# Patient Record
Sex: Female | Born: 1965 | Race: White | Hispanic: No | Smoking: Never smoker
Health system: Southern US, Community
[De-identification: ages and names within clinical notes are randomized; demographics above are authoritative.]

## PROBLEM LIST (undated history)

## (undated) DIAGNOSIS — K760 Fatty (change of) liver, not elsewhere classified: Secondary | ICD-10-CM

## (undated) DIAGNOSIS — I10 Essential (primary) hypertension: Secondary | ICD-10-CM

## (undated) DIAGNOSIS — G43909 Migraine, unspecified, not intractable, without status migrainosus: Secondary | ICD-10-CM

## (undated) DIAGNOSIS — M1990 Unspecified osteoarthritis, unspecified site: Secondary | ICD-10-CM

## (undated) DIAGNOSIS — K219 Gastro-esophageal reflux disease without esophagitis: Secondary | ICD-10-CM

## (undated) DIAGNOSIS — R079 Chest pain, unspecified: Secondary | ICD-10-CM

## (undated) DIAGNOSIS — E559 Vitamin D deficiency, unspecified: Secondary | ICD-10-CM

## (undated) DIAGNOSIS — Z8249 Family history of ischemic heart disease and other diseases of the circulatory system: Secondary | ICD-10-CM

## (undated) DIAGNOSIS — B001 Herpesviral vesicular dermatitis: Secondary | ICD-10-CM

## (undated) DIAGNOSIS — E7800 Pure hypercholesterolemia, unspecified: Secondary | ICD-10-CM

## (undated) HISTORY — DX: Chest pain, unspecified: R07.9

## (undated) HISTORY — PX: FINGER SURGERY: SHX640

## (undated) HISTORY — PX: TONSILECTOMY, ADENOIDECTOMY, BILATERAL MYRINGOTOMY AND TUBES: SHX2538

## (undated) HISTORY — DX: Gastro-esophageal reflux disease without esophagitis: K21.9

## (undated) HISTORY — DX: Family history of ischemic heart disease and other diseases of the circulatory system: Z82.49

## (undated) HISTORY — DX: Migraine, unspecified, not intractable, without status migrainosus: G43.909

## (undated) HISTORY — PX: FOOT SURGERY: SHX648

## (undated) HISTORY — DX: Essential (primary) hypertension: I10

## (undated) HISTORY — DX: Unspecified osteoarthritis, unspecified site: M19.90

## (undated) HISTORY — PX: INNER EAR SURGERY: SHX679

## (undated) HISTORY — DX: Pure hypercholesterolemia, unspecified: E78.00

## (undated) HISTORY — DX: Herpesviral vesicular dermatitis: B00.1

## (undated) HISTORY — DX: Fatty (change of) liver, not elsewhere classified: K76.0

## (undated) HISTORY — DX: Vitamin D deficiency, unspecified: E55.9

---

## 1988-05-25 HISTORY — PX: ABDOMINAL HYSTERECTOMY: SHX81

## 2009-09-09 ENCOUNTER — Ambulatory Visit (HOSPITAL_COMMUNITY): Admission: EM | Admit: 2009-09-09 | Discharge: 2009-09-09 | Payer: Self-pay | Admitting: Emergency Medicine

## 2010-08-12 LAB — CBC
HCT: 32.5 % — ABNORMAL LOW (ref 36.0–46.0)
Hemoglobin: 11.7 g/dL — ABNORMAL LOW (ref 12.0–15.0)
MCV: 88.3 fL (ref 78.0–100.0)
Platelets: 247 10*3/uL (ref 150–400)
RBC: 3.69 MIL/uL — ABNORMAL LOW (ref 3.87–5.11)
RDW: 12.4 % (ref 11.5–15.5)
WBC: 8.8 10*3/uL (ref 4.0–10.5)

## 2010-08-12 LAB — POCT CARDIAC MARKERS: CKMB, poc: 1.9 ng/mL (ref 1.0–8.0)

## 2010-08-12 LAB — PROTIME-INR: INR: 1.06 (ref 0.00–1.49)

## 2010-08-12 LAB — DIFFERENTIAL
Basophils Relative: 1 % (ref 0–1)
Eosinophils Absolute: 0.2 10*3/uL (ref 0.0–0.7)
Eosinophils Relative: 2 % (ref 0–5)
Lymphocytes Relative: 26 % (ref 12–46)
Monocytes Absolute: 0.4 10*3/uL (ref 0.1–1.0)
Neutrophils Relative %: 67 % (ref 43–77)

## 2010-08-12 LAB — APTT: aPTT: 32 seconds (ref 24–37)

## 2010-08-12 LAB — BASIC METABOLIC PANEL
Chloride: 108 mEq/L (ref 96–112)
Creatinine, Ser: 0.73 mg/dL (ref 0.4–1.2)
GFR calc Af Amer: >60 mL/min (ref >60)
Potassium: 3.4 mEq/L — ABNORMAL LOW (ref 3.5–5.1)

## 2010-08-12 LAB — TYPE AND SCREEN: Antibody Screen: NEGATIVE

## 2011-12-02 IMAGING — CR DG HAND COMPLETE 3+V*L*
3 series · 3 of 3 positions shown · non-contrast
Comparison: None.

CLINICAL DATA: Amputated second and third fingers

LEFT HAND - COMPLETE 3+ VIEW

[x hand pa left]
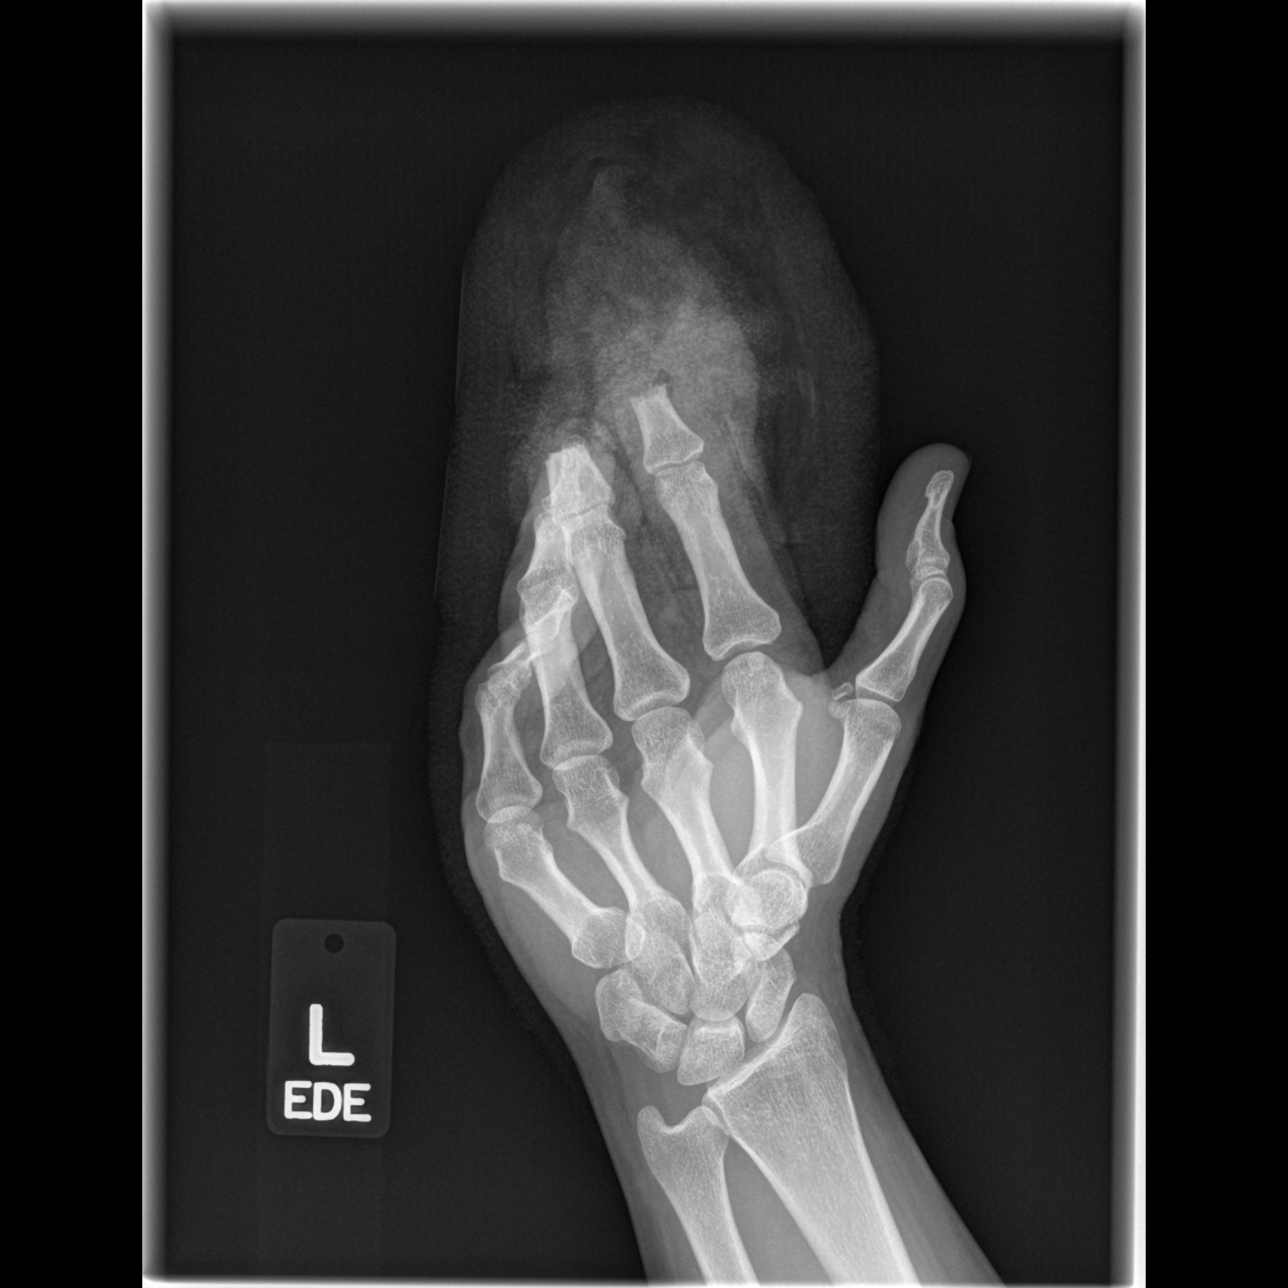

[x hand oblique left]
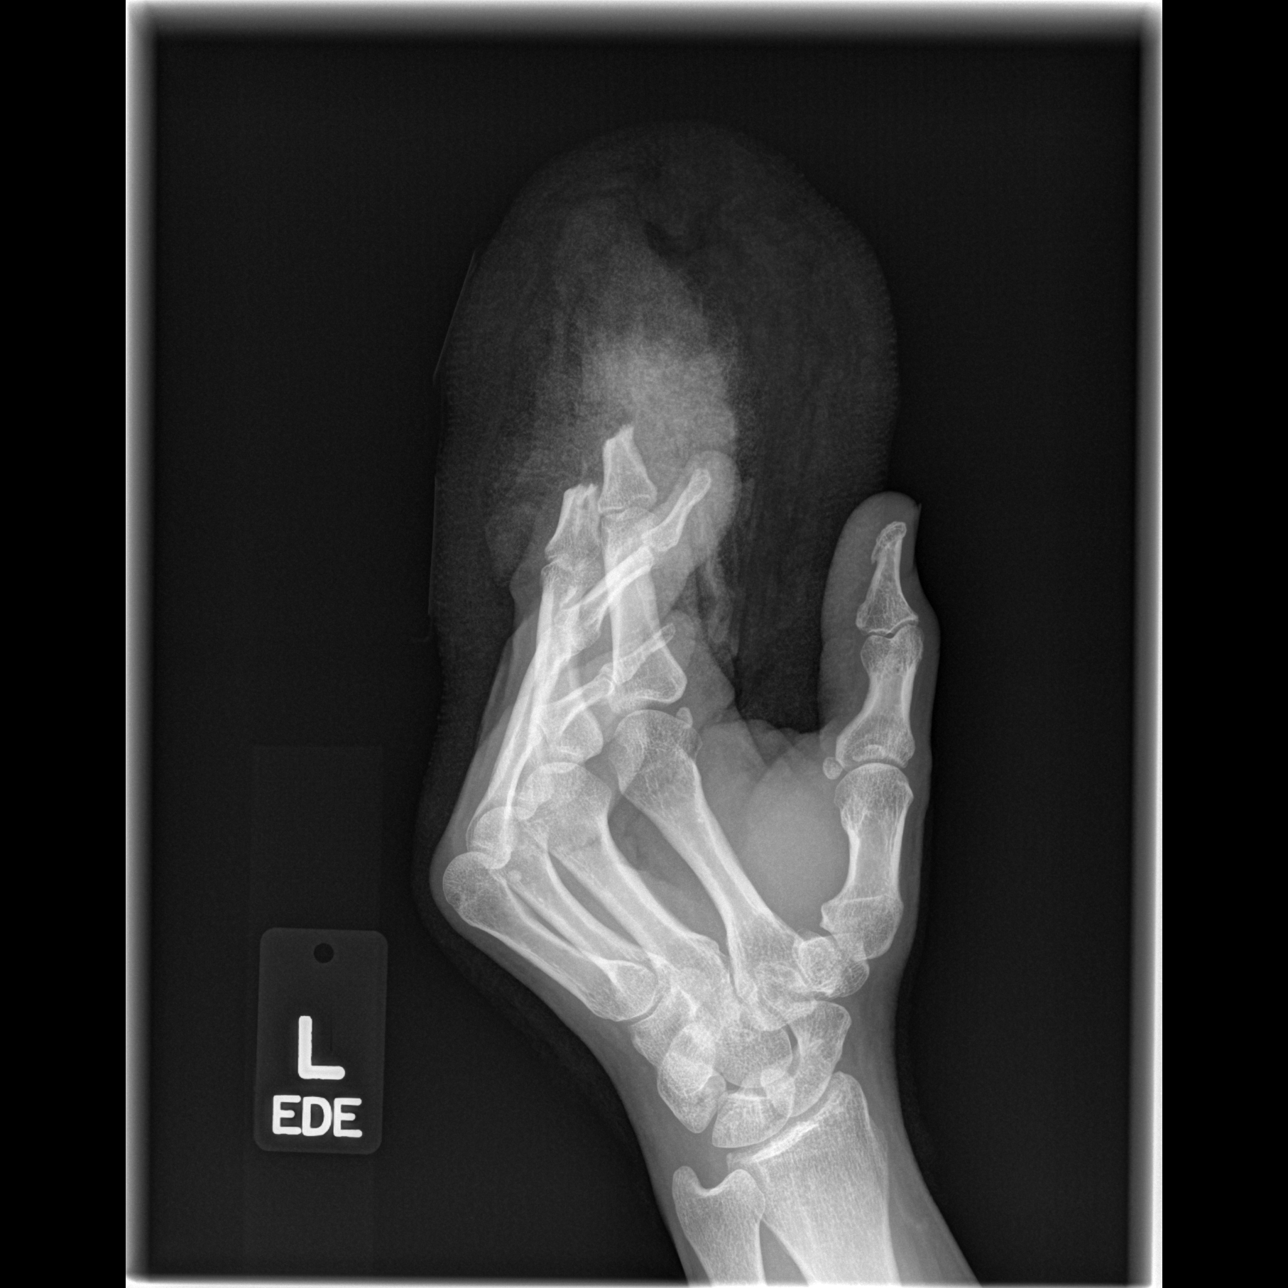

[x hand lat left]
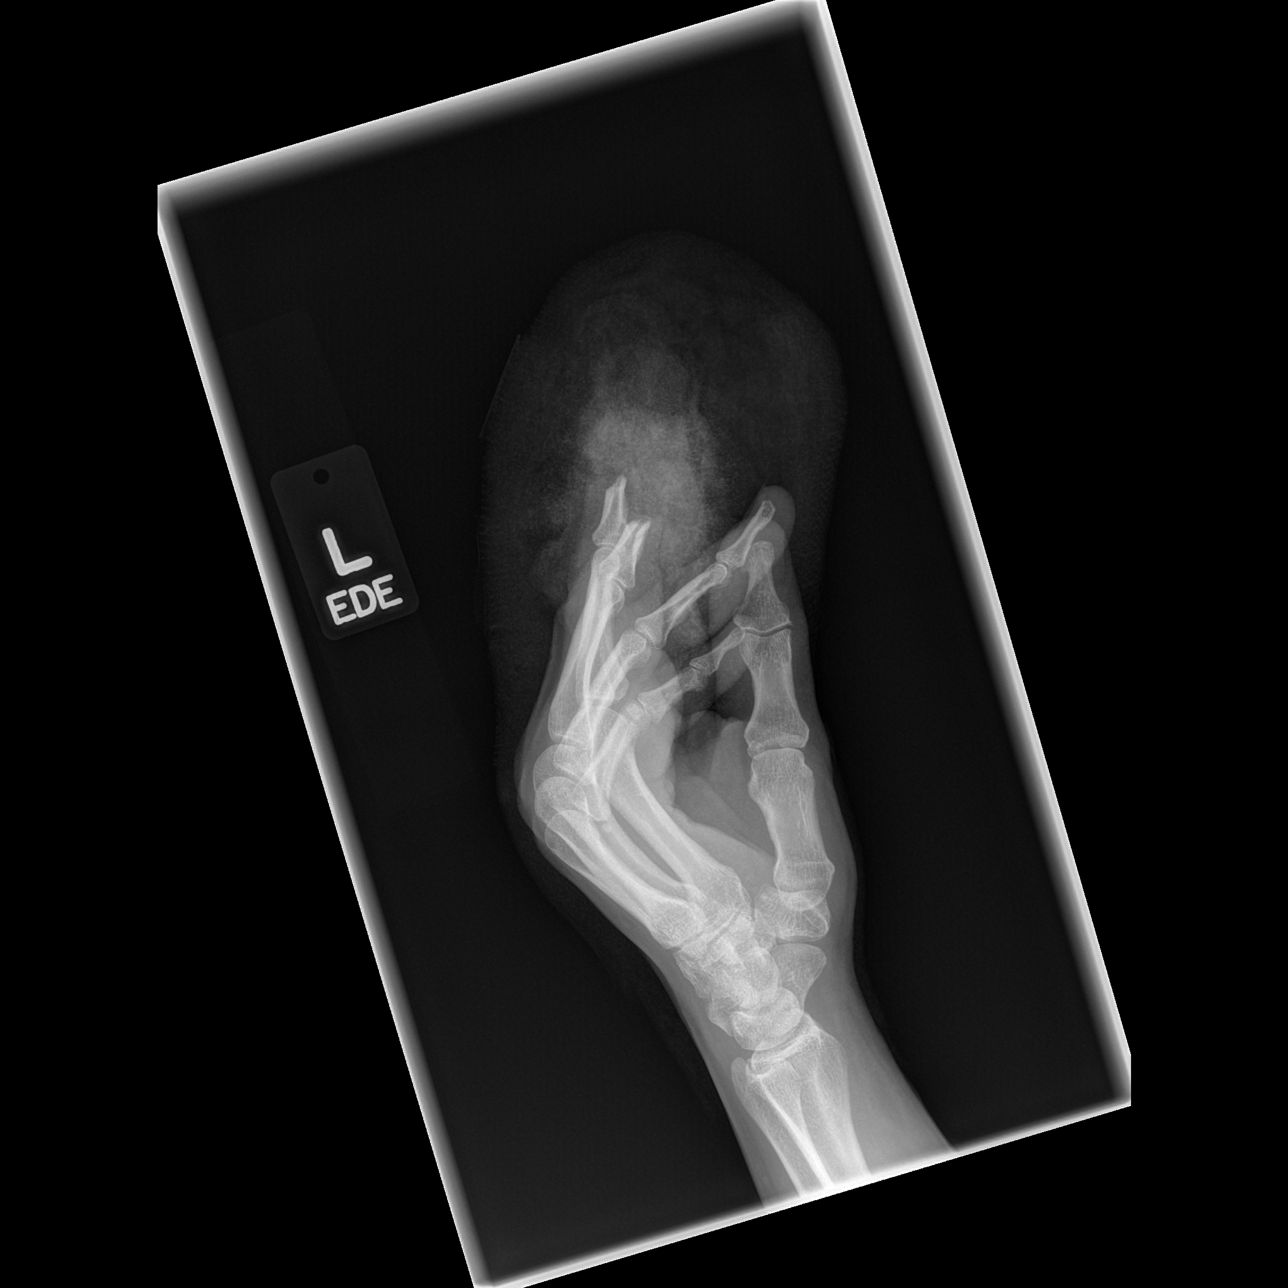

[3 of 3 positions shown; findings below may reference images not displayed]

FINDINGS: The second and third digits are amputated at the distal
middle phalanx level.  There is surrounding soft tissue swelling
and irregularity.
IMPRESSION: Second and third digits of the left hand are amputated at the
distal middle phalanx level.

## 2013-03-13 DIAGNOSIS — M25539 Pain in unspecified wrist: Secondary | ICD-10-CM

## 2013-03-13 HISTORY — DX: Pain in unspecified wrist: M25.539

## 2014-01-11 ENCOUNTER — Ambulatory Visit (INDEPENDENT_AMBULATORY_CARE_PROVIDER_SITE_OTHER): Payer: PRIVATE HEALTH INSURANCE

## 2014-01-11 VITALS — BP 129/83 | HR 75 | Resp 18

## 2014-01-11 DIAGNOSIS — M7660 Achilles tendinitis, unspecified leg: Secondary | ICD-10-CM

## 2014-01-11 DIAGNOSIS — M7730 Calcaneal spur, unspecified foot: Secondary | ICD-10-CM

## 2014-01-11 DIAGNOSIS — R52 Pain, unspecified: Secondary | ICD-10-CM

## 2014-01-11 DIAGNOSIS — M7750 Other enthesopathy of unspecified foot: Secondary | ICD-10-CM

## 2014-01-11 DIAGNOSIS — R269 Unspecified abnormalities of gait and mobility: Secondary | ICD-10-CM

## 2014-01-11 DIAGNOSIS — M722 Plantar fascial fibromatosis: Secondary | ICD-10-CM

## 2014-01-11 MED ORDER — PREDNISONE 10 MG PO KIT: PACK | ORAL | Status: DC

## 2014-01-11 NOTE — Patient Instructions (Signed)

## 2014-01-11 NOTE — Progress Notes (Signed)
   Subjective:    Patient ID: Kerri Jackson, female    DOB: 03-20-1966, 48 y.o.   MRN: [id number]  HPI my feet and heels are hurting me and have been for about 2 months now and i have an ankle brace on my right foot and hurts in my knee and hip and burns and swells and there is some numbness and they are sore and tender and i walk 8 hours at work and hurts in the morning    Review of Systems  Constitutional:       Hot flashes   Cardiovascular:       Swelling   Musculoskeletal:       Muscle pain   Neurological: Positive for headaches.  All other systems reviewed and are negative.      Objective:   Physical Exam 48 year old white female well-developed well-nourished oriented x3 presents at this time with pain discomfort right more so than left having pain in the inferior heel and arch anterior medial right ankle also she is having some pain due to the changing how she walks on her lateral leg I and hip area on the right side. No history of acute injury or trauma. Patient is been wearing good shoes just recently replaced with a new Felicie MornKirsch is although this not wear her sports at this time has had orthotics are worn and may need replacing at this point. She has not been using the orthotics and stands for long excessive hours may develop on fasciitis as well as capsulitis arthropathy. There is some swelling of the right ankle x-rays AP lateral oblique views bilateral demonstrate healed osteotomy first metatarsal bilateral with Eliberto IvoryAustin bunionectomy having been done clinically and radiographically good alignment of the hallux and lesser digits no fractures no other os abnormalities noted mild inferior calcaneal spur mild fascial thickening is identified patient wearing ankle stabilizer hasn't really helped much on her right side.      Assessment & Plan:  Assessment based on clinical reaggravated findings his plantar fasciitis/heel spur syndrome with compensatory gait changes patient is also  aggravated her ankle right more so than left forefoot is trying to walk on the ball foot as alternative to heal pressure. Also likely a living and causing some difficulty with her lateral right leg and hip. Plan at this time patient placed in fascial strapping on both feet we'll schedule for new orthotic casting his old orthotics are worn need replacing. Patient is also given a prescription for Sterapred Deas Dosepak x12 days reappointed with in 2 weeks for followup and reevaluation and appropriate orthotic dispensing within the orthotics are available. If no significant improvements may consider more aggressive invasive options steroid injection also things that physical therapy may also be considered in the future. Reschedule within the next 2 weeks as mentioned next  Alvan Dameichard Casmere Hollenbeck DPM

## 2014-01-18 ENCOUNTER — Ambulatory Visit (INDEPENDENT_AMBULATORY_CARE_PROVIDER_SITE_OTHER): Payer: PRIVATE HEALTH INSURANCE

## 2014-01-18 VITALS — BP 136/82 | HR 77 | Resp 18

## 2014-01-18 DIAGNOSIS — R52 Pain, unspecified: Secondary | ICD-10-CM

## 2014-01-18 DIAGNOSIS — M722 Plantar fascial fibromatosis: Secondary | ICD-10-CM

## 2014-01-18 DIAGNOSIS — R269 Unspecified abnormalities of gait and mobility: Secondary | ICD-10-CM

## 2014-01-18 DIAGNOSIS — M7730 Calcaneal spur, unspecified foot: Secondary | ICD-10-CM

## 2014-01-18 DIAGNOSIS — M7750 Other enthesopathy of unspecified foot: Secondary | ICD-10-CM

## 2014-01-18 MED ORDER — CELECOXIB 200 MG PO CAPS: ORAL_CAPSULE | ORAL | Status: DC

## 2014-01-18 MED ORDER — CELECOXIB 200 MG PO CAPS: 200.0000 mg | ORAL_CAPSULE | Freq: Two times a day (BID) | ORAL | Status: DC

## 2014-01-18 MED ORDER — CELECOXIB 200 MG PO CAPS
200.0000 mg | ORAL_CAPSULE | Freq: Two times a day (BID) | ORAL | Status: DC
Start: 2014-01-18 — End: 2014-01-18

## 2014-01-18 NOTE — Progress Notes (Signed)
   Subjective:    Patient ID: Kerri Jackson, female    DOB: [birth year], 48 y.o.   MRN: [id number]  HPI LAST NIGHT WAS ROUGH AND IT HURT IN MY FEET AND MY KNEE AND LEG AND MY RIGHT BUTT CHEEK    Review of Systems no new findings or systemic changes noted    Objective:   Physical Exam Or extremity objective findings as follows vascular status is intact pedal pulses palpable epicritic and proprioceptive sensations intact and unchanged patient indicates while the taping was in place the heels but much better and she her knee and hip may have felt better with taping was in place however continues to be painful and symptomatic with walking activities is a cramping in her calf at times although there is no palpable cord or nodule no increased temperature no edema or erythema noted more deep inside and aching pain or tightness that she's feeling in the joints knee and hip area not so much calf. Pain on palpation medial band plantar fascia bilateral continues to be painful and symptomatic assessment persistent plantar fasciitis/heel spur syndrome orthotics are covered for patient 80% will arrange for orthotic casting with the next week fascial strapping applied at this time      Assessment & Plan:  Assessment patient did have improvement with fascia taping reapply taping and schedule orthotic casting at her convenience patient however did not tolerate the steroid or Sterapred dose pack to [place] felt like her pressure interface and neck and chest we'll discontinue that patient request it's Celebrex at this time prescription of Celebrex 200 mg twice a day is issued at this time with one refill recheck in one week orthotic casting and then within a month for orthotic pickup and fitting and schedule contact us any difficulties also did give her a name and number for orthopedic doctor your aspirate to evaluate her knee and hip patient will follow up with Dr. at her convenience  Alvan Dame DPM

## 2014-01-18 NOTE — Addendum Note (Signed)
Addended by: Hadley Pen R on: 01/18/2014 05:57 PM   Modules accepted: Orders, Medications

## 2014-01-18 NOTE — Patient Instructions (Signed)
ICE INSTRUCTIONS  Apply ice or cold pack to the affected area at least 3 times a day for 10-15 minutes each time.  You should also use ice after prolonged activity or vigorous exercise.  Do not apply ice longer than 20 minutes at one time.  Always keep a cloth between your skin and the ice pack to prevent burns.  Being consistent and following these instructions will help control your symptoms.  We suggest you purchase a gel ice pack because they are reusable and do bit leak.  Some of them are designed to wrap around the area.  Use the method that works best for you.  Here are some other suggestions for icing.   Use a frozen bag of peas or corn-inexpensive and molds well to your body, usually stays frozen for 10 to 20 minutes.  Wet a towel with cold water and squeeze out the excess until it's damp.  Place in a bag in the freezer for 20 minutes. Then remove and use.   Recommend followup with an orthopedic doctor regarding her knee and hip for thorough evaluation

## 2014-01-23 ENCOUNTER — Ambulatory Visit (INDEPENDENT_AMBULATORY_CARE_PROVIDER_SITE_OTHER): Payer: PRIVATE HEALTH INSURANCE | Admitting: *Deleted

## 2014-01-23 DIAGNOSIS — M722 Plantar fascial fibromatosis: Secondary | ICD-10-CM

## 2014-01-23 NOTE — Progress Notes (Signed)
° °  Subjective:    Patient ID: Kerri Jackson, female    DOB: [birth year], 48 y.o.   MRN: [id number]  HPI MY FEET ARE KILLING ME AND I AM HERE TO GET CASTED. LC    Review of Systems     Objective:   Physical Exam        Assessment & Plan:

## 2014-01-23 NOTE — Patient Instructions (Signed)
I AM HERE TO GET CASTED. LC

## 2014-02-12 ENCOUNTER — Telehealth: Payer: Self-pay | Admitting: *Deleted

## 2014-02-12 NOTE — Telephone Encounter (Signed)
Could you give me a call back when you get a chance?  Thank you

## 2014-02-13 ENCOUNTER — Encounter: Payer: Self-pay | Admitting: Podiatrist

## 2014-02-13 ENCOUNTER — Ambulatory Visit (INDEPENDENT_AMBULATORY_CARE_PROVIDER_SITE_OTHER): Payer: PRIVATE HEALTH INSURANCE | Admitting: Podiatrist

## 2014-02-13 VITALS — BP 162/84 | HR 75 | Resp 18

## 2014-02-13 DIAGNOSIS — M722 Plantar fascial fibromatosis: Secondary | ICD-10-CM

## 2014-02-13 MED ORDER — TRIAMCINOLONE ACETONIDE 10 MG/ML IJ SUSP
10.0000 mg | Freq: Once | INTRAMUSCULAR | Status: AC
  Administered 2014-02-13: 10 mg

## 2014-02-13 NOTE — Telephone Encounter (Signed)
Patient called and stated that her right heel was killing her and we put her with Dr Irving Shows today and she got a injection in her right heel and her inserts came in as well and we dispensed them. lisa

## 2014-02-13 NOTE — Progress Notes (Signed)
Chief Complaint  Patient presents with  . Plantar Fasciitis    I WOULD LIKE AN INJECTION AND MY INSERTS ARE HERE ALSO     HPI: Patient is 48 y.o. female who presents today for an injection on her right heel. She states she's had injections in the past and she's overall scared of needles however her foot hurts so bad that she would like to try another injection at today's visit. She is also here to pick up her orthotics made for her by Dr. Ralene Cork.  Physical Exam  Patient is awake, alert, and oriented x 3.  In no acute distress.  Vascular status is intact with palpable pedal pulses at 2/4 DP and PT bilateral and capillary refill time within normal limits. Neurological sensation is also intact bilaterally via Semmes Weinstein monofilament at 5/5 sites. Light touch, vibratory sensation, Achilles tendon reflex is intact. Dermatological exam reveals skin color, turger and texture as normal. No open lesions present.  Musculature intact with dorsiflexion, plantarflexion, inversion, eversion. Pain on palpation plantar medial aspect of the right heel is noted. Palpable tenderness and swelling is also noted in this area.  Assessment: Plantar fasciitis right  Plan: Under sterile technique a injection of Kenalog and Marcaine plain was infiltrated into the area of maximal tenderness right heel. Patient tolerated this well. Orthotics were trimmed to fit her shoes and she is given instructions on wear. She'll be seen back for her to follow up with Dr. Ralene Cork as needed.

## 2015-05-06 ENCOUNTER — Encounter: Payer: Self-pay | Admitting: Podiatry

## 2015-05-06 ENCOUNTER — Ambulatory Visit (INDEPENDENT_AMBULATORY_CARE_PROVIDER_SITE_OTHER): Payer: PRIVATE HEALTH INSURANCE

## 2015-05-06 ENCOUNTER — Ambulatory Visit (INDEPENDENT_AMBULATORY_CARE_PROVIDER_SITE_OTHER): Payer: PRIVATE HEALTH INSURANCE | Admitting: Podiatry

## 2015-05-06 VITALS — BP 145/89 | HR 87 | Resp 14

## 2015-05-06 DIAGNOSIS — R52 Pain, unspecified: Secondary | ICD-10-CM | POA: Diagnosis not present

## 2015-05-06 DIAGNOSIS — M722 Plantar fascial fibromatosis: Secondary | ICD-10-CM

## 2015-05-06 MED ORDER — MELOXICAM 15 MG PO TABS: 15.0000 mg | ORAL_TABLET | Freq: Every day | ORAL | Status: DC

## 2015-05-06 NOTE — Addendum Note (Signed)
Addended by: Harlon FlorSOUTHERLAND, Azalee Weimer L on: 05/06/2015 10:35 AM   Modules accepted: Orders

## 2015-05-06 NOTE — Progress Notes (Signed)
Subjective:     Patient ID: Kerri Jackson, female   DOB: 1966/05/12, 49 y.o.   MRN: [id number]  HPI this patient presents the office with chief complaint of a painful left heel. This patient states that she fell down about 3 months ago and injured her left foot. She says that her heel became swollen and purplish. She says she return to work immediately and has taken ibuprofen for her pain. She is wearing [facility] that [facility]. She presents the office today for an evaluation and treatment of this condition   Review of Systems     Objective:   Physical Exam GENERAL APPEARANCE: Alert, conversant. Appropriately groomed. No acute distress.  VASCULAR: Pedal pulses palpable at  [facility] bilateral.  Capillary refill time is immediate to all digits,  Normal temperature gradient.  Digital hair growth is present bilateral  NEUROLOGIC: sensation is normal to 5.07 monofilament at 5/5 sites bilateral.  Light touch is intact bilateral, Muscle strength normal.  MUSCULOSKELETAL: acceptable muscle strength, tone and stability bilateral.  Intrinsic muscluature intact bilateral.  Rectus appearance of foot and digits noted bilateral. Palpable pain distal to the insertion plantar fascia left foot.  No evidence of redness or swelling noted.  DERMATOLOGIC: skin color, texture, and turgor are within normal limits.  No preulcerative lesions or ulcers  are seen, no interdigital maceration noted.  No open lesions present.  Digital nails are asymptomatic. No drainage noted.      Assessment:     Plantar fascitis left foot.     Plan:     ROV  X-ray reveal no pathology.  Patient was told to discontinue her ibuprofen and take Mobic.  Two plantar fascial straps were dispensed. RTC 1 week for reevaluation and possible consider unna boot application if the problem persists.  Helane GuntherGregory Dawson Hollman DPM

## 2015-05-13 ENCOUNTER — Ambulatory Visit: Payer: PRIVATE HEALTH INSURANCE | Admitting: Podiatry

## 2015-05-30 ENCOUNTER — Ambulatory Visit (INDEPENDENT_AMBULATORY_CARE_PROVIDER_SITE_OTHER): Payer: PRIVATE HEALTH INSURANCE | Admitting: Sports Medicine

## 2015-05-30 ENCOUNTER — Encounter: Payer: Self-pay | Admitting: Sports Medicine

## 2015-05-30 DIAGNOSIS — M79672 Pain in left foot: Secondary | ICD-10-CM | POA: Diagnosis not present

## 2015-05-30 DIAGNOSIS — M79671 Pain in right foot: Secondary | ICD-10-CM

## 2015-05-30 DIAGNOSIS — M722 Plantar fascial fibromatosis: Secondary | ICD-10-CM

## 2015-05-30 MED ORDER — TRIAMCINOLONE ACETONIDE 10 MG/ML IJ SUSP: 10.0000 mg | Freq: Once | INTRAMUSCULAR | Status: DC

## 2015-05-30 MED ORDER — METHYLPREDNISOLONE 4 MG PO TBPK: ORAL_TABLET | ORAL | Status: DC

## 2015-05-30 NOTE — Patient Instructions (Signed)

## 2015-05-30 NOTE — Progress Notes (Signed)
Patient ID: Kerri Jackson, female   DOB: 1966-05-16, 50 y.o.   MRN: [id number] Subjective: Kerri Jackson is a 50 y.o. female patient presents to office with complaint of heel pain on the left>right. Patient admits to post static dyskinesia for several months in duration. Saw Dr. Prudence Davidson earlier in December and reports that she is still in a lot of pain, was given a fascial strap in which she had to pay $100 for because her insurance would not cover it patient states that she has had a known history of this states that her plantar fasciitis on the right is doing much better with the plantar fascial strap; admits to a previous history of fasciotomy on the right side.  However, patient states that her left heel is extremely tender, tender, most with pressure after a period sitting states that also her muscles in the left leg are extremely tight and feel as if the pain is shooting up the back of the leg.  In the past. Patient has had injections orthotics several treatments for fasciitis; denies any recent injection within the last year.  Patient states that she gets the most relief with ice the fascial brace is and Motrin.  Denies any other pedal complaints at this time.   There are no active problems to display for this patient.  Current Outpatient Prescriptions on File Prior to Visit  Medication Sig Dispense Refill  . cefdinir (OMNICEF) 300 MG capsule     . celecoxib (CELEBREX) 200 MG capsule Take one capsule by mouth two times daily 180 capsule 0  . fluconazole (DIFLUCAN) 150 MG tablet     . meloxicam (MOBIC) 15 MG tablet Take 1 tablet (15 mg total) by mouth daily. 30 tablet 0  . metoprolol succinate (TOPROL-XL) 100 MG 24 hr tablet     . polyethylene glycol powder (GLYCOLAX/MIRALAX) powder     . PredniSONE 10 MG KIT Take as instructed for 12 days, 6 pills days 1 and 2,  5 pills each days 3 and 4, 4 pills each days 5 and 6.. and so on until completed 48 each 0  . verapamil (CALAN-SR) 120 MG CR tablet      No  current facility-administered medications on file prior to visit.   No Known Allergies   Objective: Physical Exam General: The patient is alert and oriented x3 in no acute distress.  Dermatology: Skin is warm, dry and supple bilateral lower extremities. Nails 1-10 are normal. There is no erythema,  no eccymosis, no open lesions present. Integument is otherwise unremarkable.  Vascular: Dorsalis Pedis pulse and Posterior Tibial pulse are 2/4 bilateral. Capillary fill time is immediate to all digits.  Neurological: Grossly intact to light touch with an achilles reflex of +2/5 and a negative Tinel's sign bilateral.  Musculoskeletal: Tenderness to palpation at the medial calcaneal tubercale and through the insertion of the plantar fascia on the left foot with associated soft tissue swelling. No pain with compression of calcaneus bilateral. No pain with tuning fork to calcaneus bilateral. No pain with calf compression bilateral. There is decreased Ankle joint range of motion bilateral  Left significantly greater than right. All other joints range of motion within normal limits bilateral. Strength 5/5 in all groups bilateral.   Assessment and Plan: Problem List Items Addressed This Visit    None    Visit Diagnoses    Plantar fasciitis, bilateral    -  Primary    L>R    Relevant Medications    triamcinolone acetonide (  KENALOG) 10 MG/ML injection 10 mg (Start on 05/30/2015  4:45 PM)    methylPREDNISolone (MEDROL DOSEPAK) 4 MG TBPK tablet    Foot pain, bilateral        Relevant Medications    triamcinolone acetonide (KENALOG) 10 MG/ML injection 10 mg (Start on 05/30/2015  4:45 PM)    methylPREDNISolone (MEDROL DOSEPAK) 4 MG TBPK tablet      -Complete examination performed. Discussed with patient in detail the condition of plantar fasciitis, how this occurs and general treatment options. Explained both conservative and surgical treatments.  -After oral consent and aseptic prep, injected a mixture  containing 1 ml of 2%  plain lidocaine, 1 ml 0.5% plain marcaine, 0.5 ml of kenalog 10 and 0.5 ml of dexamethasone phosphate into left heel. Post-injection care discussed with patient.  -Rx Medrol dose pack Once complete to take Motrin as needed. No other anti-inflammatories given this time due to Patient history of hypertension.  -Recommended Patient to use cam walker of which she owns for 1 week with slow transition back to good supportive shoes and advised use of inserts.  -Patient to continue with plantar fascial brace with bilateral  -Patient declined night splint due to concern of cost If not covered by her insurance -Explained and dispensed to patient daily stretching exercises. -Recommend patient to ice affected area 1-2x daily. -Patient to return to office in 3 weeks for follow up or sooner if problems or questions arise.  At next encoutner will consider his physical therapy at [facility] with dry needling.   Landis Martins, DPM

## 2015-06-20 ENCOUNTER — Encounter: Payer: Self-pay | Admitting: Sports Medicine

## 2015-06-20 ENCOUNTER — Ambulatory Visit (INDEPENDENT_AMBULATORY_CARE_PROVIDER_SITE_OTHER): Payer: PRIVATE HEALTH INSURANCE | Admitting: Sports Medicine

## 2015-06-20 DIAGNOSIS — M79672 Pain in left foot: Secondary | ICD-10-CM

## 2015-06-20 DIAGNOSIS — M79671 Pain in right foot: Secondary | ICD-10-CM

## 2015-06-20 DIAGNOSIS — M722 Plantar fascial fibromatosis: Secondary | ICD-10-CM | POA: Diagnosis not present

## 2015-06-20 MED ORDER — OXYCODONE-ACETAMINOPHEN 7.5-325 MG PO TABS: 1.0000 | ORAL_TABLET | Freq: Three times a day (TID) | ORAL | Status: DC | PRN

## 2015-06-20 NOTE — Progress Notes (Signed)
Patient ID: Kerri Jackson, female   DOB: July 28, 1965, 50 y.o.   MRN: [id number]  Subjective: Kerri Jackson is a 50 y.o. female patient presents to office with complaint of heel pain on the left>right. Patient admits to post static dyskinesia for several months in duration. States that the injection helped for a few hours then got really painful; reports that she went walking on beach barefoot and pain got really bad; states that she went to get a second opinion who said that she had fasciitis but could not rule out hair line fracture.  Denies any other pedal complaints at this time.   There are no active problems to display for this patient.  Current Outpatient Prescriptions on File Prior to Visit  Medication Sig Dispense Refill  . cefdinir (OMNICEF) 300 MG capsule     . celecoxib (CELEBREX) 200 MG capsule Take one capsule by mouth two times daily 180 capsule 0  . fluconazole (DIFLUCAN) 150 MG tablet     . meloxicam (MOBIC) 15 MG tablet Take 1 tablet (15 mg total) by mouth daily. 30 tablet 0  . methylPREDNISolone (MEDROL DOSEPAK) 4 MG TBPK tablet Take as instructed 21 tablet 0  . metoprolol succinate (TOPROL-XL) 100 MG 24 hr tablet     . polyethylene glycol powder (GLYCOLAX/MIRALAX) powder     . PredniSONE 10 MG KIT Take as instructed for 12 days, 6 pills days 1 and 2,  5 pills each days 3 and 4, 4 pills each days 5 and 6.. and so on until completed 48 each 0  . verapamil (CALAN-SR) 120 MG CR tablet      Current Facility-Administered Medications on File Prior to Visit  Medication Dose Route Frequency Provider Last Rate Last Dose  . triamcinolone acetonide (KENALOG) 10 MG/ML injection 10 mg  10 mg Other Once [employer], DPM       No Known Allergies   Objective: Physical Exam General: The patient is alert and oriented x3 in no acute distress.  Dermatology: Skin is warm, dry and supple bilateral lower extremities. Nails 1-10 are normal. There is no erythema,  no eccymosis, no open lesions  present. Integument is otherwise unremarkable.  Vascular: Dorsalis Pedis pulse and Posterior Tibial pulse are 2/4 bilateral. Capillary fill time is immediate to all digits.  Neurological: Grossly intact to light touch with an achilles reflex of +2/5 and a negative Tinel's sign bilateral.  Musculoskeletal: Moderate tenderness to palpation at the medial calcaneal tubercale and through the insertion of the plantar fascia on the left foot with associated soft tissue swelling. No pain on right. No pain with compression of calcaneus bilateral. No pain with tuning fork to calcaneus bilateral. No pain with calf compression bilateral. There is decreased Ankle joint range of motion bilateral  Left significantly greater than right. All other joints range of motion within normal limits bilateral. Strength 5/5 in all groups bilateral.   Assessment and Plan: Problem List Items Addressed This Visit    None    Visit Diagnoses    Plantar fasciitis, bilateral    -  Primary    L>R with acute exacerabation     Relevant Medications    oxyCODONE-acetaminophen (PERCOCET) 7.5-325 MG tablet    Foot pain, bilateral        Relevant Medications    oxyCODONE-acetaminophen (PERCOCET) 7.5-325 MG tablet      -Complete examination performed.  -Previous xrays reviewed -Placed Patient in short leg fiberglass cast on left due to extreme symptoms and to protect  area to allow soft tissues/bone to heal. Advised patient to start 325 mg aspirin to prevent DVT. Advised patient she should be nonweightbearing with assistive crushes thus patient will have to refrain from work for the next 2 weeks; patient will bring in [facility] paperwork.  -Prescribed Percocet to take as needed for pain -Recommend patient to ice affected area 1-2x daily. -Patient to return to office in 2 weeks for follow up or sooner if problems or questions arise.   Landis Martins, DPM

## 2015-06-21 ENCOUNTER — Encounter: Payer: Self-pay | Admitting: *Deleted

## 2015-06-21 ENCOUNTER — Telehealth: Payer: Self-pay | Admitting: *Deleted

## 2015-06-21 NOTE — Telephone Encounter (Addendum)
Pt's HR - Heriberto Antigua request a note from Dr. [name] stating pt is to be out of work for 2 weeks.  Letter done and faxed.

## 2015-07-04 ENCOUNTER — Encounter: Payer: Self-pay | Admitting: Sports Medicine

## 2015-07-04 ENCOUNTER — Ambulatory Visit (INDEPENDENT_AMBULATORY_CARE_PROVIDER_SITE_OTHER): Payer: PRIVATE HEALTH INSURANCE

## 2015-07-04 ENCOUNTER — Ambulatory Visit (INDEPENDENT_AMBULATORY_CARE_PROVIDER_SITE_OTHER): Payer: PRIVATE HEALTH INSURANCE | Admitting: Sports Medicine

## 2015-07-04 DIAGNOSIS — M722 Plantar fascial fibromatosis: Secondary | ICD-10-CM | POA: Diagnosis not present

## 2015-07-04 DIAGNOSIS — M79672 Pain in left foot: Secondary | ICD-10-CM

## 2015-07-04 DIAGNOSIS — M79671 Pain in right foot: Secondary | ICD-10-CM

## 2015-07-04 MED ORDER — DICLOFENAC SODIUM 75 MG PO TBEC: 75.0000 mg | DELAYED_RELEASE_TABLET | Freq: Two times a day (BID) | ORAL | Status: DC

## 2015-07-04 MED ORDER — METHYLPREDNISOLONE 4 MG PO TBPK: ORAL_TABLET | ORAL | Status: DC

## 2015-07-04 NOTE — Progress Notes (Signed)
Patient ID: Kerri Jackson, female   DOB: 11/17/1965, 50 y.o.   MRN: [id number]  Subjective: Kerri Jackson is a 50 y.o. female patient returns to office for follow up eval for heel pain on the left>right. Patient has been in cast for 2 weeks with no issues. States that she took about 5 motrin and 5 pain pills. States that the area feels better and the cast was very helpful in making sure she stays off her foot; Patient came into office today walking on cast states that she does this sometime because her house has 16 steps.  Denies any other pedal complaints at this time.   There are no active problems to display for this patient.  Current Outpatient Prescriptions on File Prior to Visit  Medication Sig Dispense Refill  . celecoxib (CELEBREX) 200 MG capsule Take one capsule by mouth two times daily 180 capsule 0  . meloxicam (MOBIC) 15 MG tablet Take 1 tablet (15 mg total) by mouth daily. 30 tablet 0  . metoprolol succinate (TOPROL-XL) 100 MG 24 hr tablet     . oxyCODONE-acetaminophen (PERCOCET) 7.5-325 MG tablet Take 1 tablet by mouth every 8 (eight) hours as needed for severe pain. 60 tablet 0  . polyethylene glycol powder (GLYCOLAX/MIRALAX) powder     . verapamil (CALAN-SR) 120 MG CR tablet      Current Facility-Administered Medications on File Prior to Visit  Medication Dose Route Frequency Provider Last Rate Last Dose  . triamcinolone acetonide (KENALOG) 10 MG/ML injection 10 mg  10 mg Other Once [employer], DPM       No Known Allergies   Objective: Physical Exam General: The patient is alert and oriented x3 in no acute distress. Cast to left lower extremity is soiled with evidence of breakdown from walking on cast  Dermatology: Skin is warm, dry and supple bilateral lower extremities. Nails 1-10 are normal. There is no erythema,  no eccymosis, no open lesions present. Integument is otherwise unremarkable.  Vascular: Dorsalis Pedis pulse and Posterior Tibial pulse are 2/4 bilateral.  Capillary fill time is immediate to all digits.  Neurological: Grossly intact to light touch with an achilles reflex of +2/5 and a negative Tinel's sign bilateral.  Musculoskeletal: Decreased tenderness to palpation at the medial calcaneal tubercale and through the insertion of the plantar fascia on the left foot with associated decreased soft tissue swelling. No pain on right. No pain with compression of calcaneus bilateral. No pain with tuning fork to calcaneus bilateral. No pain with calf compression bilateral. There is decreased Ankle joint range of motion bilateral  Left significantly greater than right. All other joints range of motion within normal limits bilateral. Strength 5/5 in all groups bilateral.   Xrays, Left foot: Normal osseous mineralization. There is no fracture or dislocation. Inferior calcaneal heel spur with mild thickening of the plantar fascia hardware intact to the first metatarsal, soft tissues within normal limits. No other acute pathology.  Assessment and Plan: Problem List Items Addressed This Visit    None    Visit Diagnoses    Plantar fasciitis, bilateral    -  Primary    L>R    Foot pain, bilateral          -Complete examination performed -Removed fiberglass below knee cast on left -Xrays obtained and reviewed -Transitioned Patient to postop shoe on left. Advised patient that she should limit ambulation to necessity and should refrain from work for another 2 weeks If modification/light duty is not possible. Patient  to call HR to find out work restrictions and will call office to have office to fax a work note on her behalf.  -Prescribed diclofenac to start once Medrol Dosepak is completed and continue with Percocet as needed for pain -Patient may use an Ace wrap to support area as needed -Recommend patient to ice affected area 1-2x daily. -Patient to return to office in 2 weeks for follow up or sooner if problems or questions arise.   [name],  DPM

## 2015-07-08 ENCOUNTER — Encounter: Payer: Self-pay | Admitting: Sports Medicine

## 2015-07-18 ENCOUNTER — Encounter: Payer: Self-pay | Admitting: Sports Medicine

## 2015-07-18 ENCOUNTER — Ambulatory Visit (INDEPENDENT_AMBULATORY_CARE_PROVIDER_SITE_OTHER): Payer: PRIVATE HEALTH INSURANCE | Admitting: Sports Medicine

## 2015-07-18 DIAGNOSIS — M79671 Pain in right foot: Secondary | ICD-10-CM | POA: Diagnosis not present

## 2015-07-18 DIAGNOSIS — M79672 Pain in left foot: Secondary | ICD-10-CM

## 2015-07-18 DIAGNOSIS — M722 Plantar fascial fibromatosis: Secondary | ICD-10-CM | POA: Diagnosis not present

## 2015-07-18 NOTE — Progress Notes (Signed)
Patient ID: Kerri Jackson, female   DOB: 1965/07/20, 50 y.o.   MRN: [id number]  Subjective: Kerri Jackson is a 50 y.o. female patient returns to office for follow up eval for heel pain on the left>right. Patient has been in post op shoe for 2 weeks with no issues. States that her foot feels a little better but still hurts some and feels "tight".  Denies any other pedal complaints at this time.   There are no active problems to display for this patient.  Current Outpatient Prescriptions on File Prior to Visit  Medication Sig Dispense Refill  . celecoxib (CELEBREX) 200 MG capsule Take one capsule by mouth two times daily 180 capsule 0  . diclofenac (VOLTAREN) 75 MG EC tablet Take 1 tablet (75 mg total) by mouth 2 (two) times daily. 30 tablet 0  . meloxicam (MOBIC) 15 MG tablet Take 1 tablet (15 mg total) by mouth daily. 30 tablet 0  . methylPREDNISolone (MEDROL DOSEPAK) 4 MG TBPK tablet Take as instructed 21 tablet 0  . metoprolol succinate (TOPROL-XL) 100 MG 24 hr tablet     . oxyCODONE-acetaminophen (PERCOCET) 7.5-325 MG tablet Take 1 tablet by mouth every 8 (eight) hours as needed for severe pain. 60 tablet 0  . polyethylene glycol powder (GLYCOLAX/MIRALAX) powder     . verapamil (CALAN-SR) 120 MG CR tablet      Current Facility-Administered Medications on File Prior to Visit  Medication Dose Route Frequency Provider Last Rate Last Dose  . triamcinolone acetonide (KENALOG) 10 MG/ML injection 10 mg  10 mg Other Once [employer], DPM       No Known Allergies   Objective: Physical Exam General: The patient is alert and oriented x3 in no acute distress.  Dermatology: Skin is warm, dry and supple bilateral lower extremities. Nails 1-10 are normal. There is no erythema,  no eccymosis, no open lesions present. Integument is otherwise unremarkable.  Vascular: Dorsalis Pedis pulse and Posterior Tibial pulse are 2/4 bilateral. Capillary fill time is immediate to all digits.  Neurological:  Grossly intact to light touch with an achilles reflex of +2/5 and a negative Tinel's sign bilateral.  Musculoskeletal: Decreased tenderness to palpation at the medial calcaneal tubercale and through the insertion of the plantar fascia on the left foot with associated decreased soft tissue swelling. No pain on right. No pain with compression of calcaneus bilateral. No pain with tuning fork to calcaneus bilateral. No pain with calf compression bilateral. There is decreased Ankle joint range of motion bilateral  Left significantly greater than right. All other joints range of motion within normal limits bilateral. Strength 5/5 in all groups bilateral.   Assessment and Plan: Problem List Items Addressed This Visit    None    Visit Diagnoses    Plantar fasciitis, bilateral    -  Primary    L>R    Foot pain, bilateral          -Complete examination performed -Patient to transition from postop shoe on left to good supportive sneaker with use of fascial brace. Advised patient that she is cleared to return to regular duty work starting her normal Monday night shift on 07-22-15 with the use of fascial brace on left  -Patient to take diclofenac or Ibuprofren as needed for pain and inflammation -Recommend patient to ice affected area 1-2x daily. -Rx given for PT at [facility] 2x/wk for 4wks -Patient to return to office in 4 weeks for follow up or sooner if problems or questions arise.  Landis Martins, DPM

## 2015-07-22 ENCOUNTER — Telehealth: Payer: Self-pay | Admitting: *Deleted

## 2015-07-22 ENCOUNTER — Encounter: Payer: Self-pay | Admitting: *Deleted

## 2015-07-22 NOTE — Telephone Encounter (Addendum)
-----   Message from [name], [place] sent at 07/18/2015  9:49 AM EST ----- Regarding: Fax to Work/HR  Can we fax this to patient's job/HR dept... Thanks Dr. [name]   "Return to regular duty work starting her normal Monday night shift on 07-22-15 with the use of fascial brace on left" 07/22/2015-INFORMED PT HER NOTE TO return to work could be faxed if she had the number, pt states she will pick up the note today.  I informed pt there would not be anyone in the [place] office today, that I would mail it to her.  08/01/2015-A. HORTON STATES PT IS AT [facility] without her Physical therapy order.  I reviewed orders and Dr. [name] had sent PT orders with pt.  I wrote out evaluation and treat for plantar fasciitis B/L ROM, Home exercises and faxed to [phone].

## 2015-08-15 ENCOUNTER — Ambulatory Visit (INDEPENDENT_AMBULATORY_CARE_PROVIDER_SITE_OTHER): Payer: PRIVATE HEALTH INSURANCE | Admitting: Sports Medicine

## 2015-08-15 ENCOUNTER — Telehealth: Payer: Self-pay | Admitting: *Deleted

## 2015-08-15 ENCOUNTER — Encounter: Payer: Self-pay | Admitting: Sports Medicine

## 2015-08-15 DIAGNOSIS — M722 Plantar fascial fibromatosis: Secondary | ICD-10-CM | POA: Diagnosis not present

## 2015-08-15 DIAGNOSIS — M79672 Pain in left foot: Secondary | ICD-10-CM

## 2015-08-15 DIAGNOSIS — M79671 Pain in right foot: Secondary | ICD-10-CM

## 2015-08-15 NOTE — Progress Notes (Signed)
Patient ID: Kerri Jackson, female   DOB: Oct 04, 1965, 50 y.o.   MRN: [id number]  Subjective: Kerri Jackson is a 50 y.o. female patient returns to office for follow up eval for heel pain on the left>right. Patient has been  Back at work and states that her left heel is still in pain and she wants it surgically taken care of admits that she had right procedure done for her plantar fascia and that pain on the right side is tolerable. However, the pain on her left is not.  Denies any other pedal complaints at this time.   Patient has completed physical therapy and at this point still desires surgery.   There are no active problems to display for this patient.  Current Outpatient Prescriptions on File Prior to Visit  Medication Sig Dispense Refill  . celecoxib (CELEBREX) 200 MG capsule Take one capsule by mouth two times daily 180 capsule 0  . diclofenac (VOLTAREN) 75 MG EC tablet Take 1 tablet (75 mg total) by mouth 2 (two) times daily. 30 tablet 0  . meloxicam (MOBIC) 15 MG tablet Take 1 tablet (15 mg total) by mouth daily. 30 tablet 0  . methylPREDNISolone (MEDROL DOSEPAK) 4 MG TBPK tablet Take as instructed 21 tablet 0  . metoprolol succinate (TOPROL-XL) 100 MG 24 hr tablet     . oxyCODONE-acetaminophen (PERCOCET) 7.5-325 MG tablet Take 1 tablet by mouth every 8 (eight) hours as needed for severe pain. 60 tablet 0  . polyethylene glycol powder (GLYCOLAX/MIRALAX) powder     . verapamil (CALAN-SR) 120 MG CR tablet      Current Facility-Administered Medications on File Prior to Visit  Medication Dose Route Frequency Provider Last Rate Last Dose  . triamcinolone acetonide (KENALOG) 10 MG/ML injection 10 mg  10 mg Other Once [name], DPM       No Known Allergies   Past Surgical History  Procedure Laterality Date  . Cesarean section    . Finger surgery      left hand    Social History   Social History  . Marital Status: Divorced    Spouse Name: N/A  . Number of Children: N/A  . Years  of Education: N/A   Occupational History  . Not on file.   Social History Main Topics  . Smoking status: Never Smoker   . Smokeless tobacco: Never Used  . Alcohol Use: No  . Drug Use: No  . Sexual Activity: Not on file   Other Topics Concern  . Not on file   Social History Narrative   No family history on file.  Objective: Physical Exam General: The patient is alert and oriented x3 in no acute distress.  Dermatology: Skin is warm, dry and supple bilateral lower extremities. Nails 1-10 are normal. There is no erythema,  no eccymosis, no open lesions present. Integument is otherwise unremarkable.  Vascular: Dorsalis Pedis pulse and Posterior Tibial pulse are 2/4 bilateral. Capillary fill time is immediate to all digits.  Neurological: Grossly intact to light touch with an achilles reflex of +2/5 and a negative Tinel's sign bilateral.  Musculoskeletal: There is tenderness to palpation at the medial calcaneal tubercale and through the insertion of the plantar fascia on the left foot with associated decreased soft tissue swelling. No pain on right. No pain with compression of calcaneus bilateral. No pain with tuning fork to calcaneus bilateral. No pain with calf compression bilateral. There is improved Ankle joint range of motion bilateral. All other joints range of  motion within normal limits bilateral. Strength 5/5 in all groups bilateral.   Assessment and Plan: Problem List Items Addressed This Visit    None    Visit Diagnoses    Plantar fasciitis, bilateral    -  Primary    L>R    Foot pain, bilateral          -Complete examination performed -Patient opt for surgical management. Consent obtained for Left open versus endoscopic plantar fascial release with removal of heel spur. Pre and Post op course explained. Risks, benefits, alternatives explained. No guarantees given or implied. Surgical booking slip submitted and provided patient with Surgical packet and info for [facility].   Office to schedule. - Patient will use her CAM boot that she already owns post op. -Patient to take diclofenac or Ibuprofren as needed for pain and inflammation. -Recommend patient to ice affected area 1-2x daily as needed.  - Recommend to continue with daily stretching in good supportive shoes in the meantime.  -Patient to send FMLA and car insurance paperwork to office for completion for anticipated surgery recovery time frame -Patient to return to office after surgery or sooner if problems or questions arise.   Asencion Islamitorya Kyrianna Barletta, DPM

## 2015-08-15 NOTE — Telephone Encounter (Signed)
"  My name is Kerri Jackson."

## 2015-08-15 NOTE — Patient Instructions (Signed)
Pre-Operative Instructions  Congratulations, you have decided to take an important step to improving your quality of life.  You can be assured that the doctors of [facility] will be with you every step of the way.  1. Plan to be at the surgery center/hospital at least 1 (one) hour prior to your scheduled time unless otherwise directed by the surgical center/hospital staff.  You must have a responsible adult accompany you, remain during the surgery and drive you home.  Make sure you have directions to the surgical center/hospital and know how to get there on time. 2. For hospital based surgery you will need to obtain a history and physical form from your family physician within 1 month prior to the date of surgery- we will give you a form for you primary physician.  3. We make every effort to accommodate the date you request for surgery.  There are however, times where surgery dates or times have to be moved.  We will contact you as soon as possible if a change in schedule is required.   4. No Aspirin/Ibuprofen for one week before surgery.  If you are on aspirin, any non-steroidal anti-inflammatory medications (Mobic, Aleve, Ibuprofen) you should stop taking it 7 days prior to your surgery.  You make take Tylenol  For pain prior to surgery.  5. Medications- If you are taking daily heart and blood pressure medications, seizure, reflux, allergy, asthma, anxiety, pain or diabetes medications, make sure the surgery center/hospital is aware before the day of surgery so they may notify you which medications to take or avoid the day of surgery. 6. No food or drink after midnight the night before surgery unless directed otherwise by surgical center/hospital staff. 7. No alcoholic beverages 24 hours prior to surgery.  No smoking 24 hours prior to or 24 hours after surgery. 8. Wear loose pants or shorts- loose enough to fit over bandages, boots, and casts. 9. No slip on shoes, sneakers are best. 10. Bring  your boot with you to the surgery center/hospital.  Also bring crutches or a walker if your physician has prescribed it for you.  If you do not have this equipment, it will be provided for you after surgery. 11. If you have not been contracted by the surgery center/hospital by the day before your surgery, call to confirm the date and time of your surgery. 12. Leave-time from work may vary depending on the type of surgery you have.  Appropriate arrangements should be made prior to surgery with your employer. 13. Prescriptions will be provided immediately following surgery by your doctor.  Have these filled as soon as possible after surgery and take the medication as directed. 14. Remove nail polish on the operative foot. 15. Wash the night before surgery.  The night before surgery wash the foot and leg well with the antibacterial soap provided and water paying special attention to beneath the toenails and in between the toes.  Rinse thoroughly with water and dry well with a towel.  Perform this wash unless told not to do so by your physician.  Enclosed: 1 Ice pack (please put in freezer the night before surgery)   1 Hibiclens skin cleaner   Pre-op Instructions  If you have any questions regarding the instructions, do not hesitate to call our office.  [place]: [address] [place], [place] [address] [phone]  [place]: [address] [place], [place] [address] [phone]  [place]: [address]  [place], [place] [address] [phone]  Dr. Richard   Tuchman DPM, Dr. Norman Regal DPM Dr. Richard Sikora DPM, Dr. M. Todd Hyatt DPM, Dr. Sheniah Supak DPM 

## 2015-08-16 NOTE — Telephone Encounter (Signed)
I'm returning your call.  "I saw Dr. Marylene LandStover today.  I'd like to schedule my surgery."  She can do it April 3.  "That date will be fine."  You will need to go ahead and register with the surgical center.  You can do it on-line or call them.  Surgical center will call you with the arrival time the Friday before surgery date.

## 2015-08-19 DIAGNOSIS — M722 Plantar fascial fibromatosis: Secondary | ICD-10-CM

## 2015-08-26 ENCOUNTER — Encounter: Payer: Self-pay | Admitting: Sports Medicine

## 2015-08-26 DIAGNOSIS — M722 Plantar fascial fibromatosis: Secondary | ICD-10-CM | POA: Diagnosis not present

## 2015-08-26 DIAGNOSIS — M7732 Calcaneal spur, left foot: Secondary | ICD-10-CM | POA: Diagnosis not present

## 2015-08-27 ENCOUNTER — Telehealth: Payer: Self-pay | Admitting: Sports Medicine

## 2015-08-27 NOTE — Telephone Encounter (Signed)
Post op check phone call made to patient. Patient states that she is doing ok. Patient states that she has not been up on her foot. Patient has been icing and elevating as instructed. Patient denies constitutional symptoms or any pedal complaints at this time. Patient to follow up in 1 week for post op care. -Dr. Marylene LandStover

## 2015-08-28 ENCOUNTER — Telehealth: Payer: Self-pay | Admitting: *Deleted

## 2015-08-28 ENCOUNTER — Other Ambulatory Visit: Payer: Self-pay | Admitting: Sports Medicine

## 2015-08-28 DIAGNOSIS — G8918 Other acute postprocedural pain: Secondary | ICD-10-CM

## 2015-08-28 MED ORDER — HYDROMORPHONE HCL 2 MG PO TABS: 2.0000 mg | ORAL_TABLET | Freq: Four times a day (QID) | ORAL | Status: DC | PRN

## 2015-08-28 NOTE — Telephone Encounter (Addendum)
Pt request another pain medication.  Unable to leave message voicemail not set up, I would need to know which medication is needed.  Pt states she is having terrible itching to the Hydrocodone and would like a change of medication. I told pt I would write Dr. Marylene LandStover, and contact her again.  I told pt to continue to take the Benadryl she said she was taking last night and to keep her phone cut on.  Pt gave me an alternate number to call with instructions.  I called pt and informed the dilaudid rx would need to be picked up in the [place] office.  Pt asked if her boyfriend Verlin DikeGarry Glass could pick it up and I told her yes if he had ID.  09/09/2015-Ms Kinlaw states a release of Attending Physician's Statement form was faxed and the statement must be received before pt can receive her short-term disability payment, please call [phone], or fax to [phone].

## 2015-08-28 NOTE — Telephone Encounter (Signed)
Have her come by office. Dilaudid was ordered and she can pick it up from the front desk Thanks Dr. Marylene LandStover

## 2015-09-04 ENCOUNTER — Ambulatory Visit (INDEPENDENT_AMBULATORY_CARE_PROVIDER_SITE_OTHER): Payer: PRIVATE HEALTH INSURANCE | Admitting: Sports Medicine

## 2015-09-04 ENCOUNTER — Other Ambulatory Visit: Payer: Self-pay | Admitting: Sports Medicine

## 2015-09-04 DIAGNOSIS — G8918 Other acute postprocedural pain: Secondary | ICD-10-CM

## 2015-09-04 DIAGNOSIS — Z9889 Other specified postprocedural states: Secondary | ICD-10-CM

## 2015-09-04 MED ORDER — HYDROMORPHONE HCL 2 MG PO TABS: 2.0000 mg | ORAL_TABLET | Freq: Four times a day (QID) | ORAL | Status: DC | PRN

## 2015-09-04 MED ORDER — IBUPROFEN 800 MG PO TABS: ORAL_TABLET | ORAL | Status: DC

## 2015-09-04 NOTE — Progress Notes (Signed)
Patient ID: Kerri GlassingMary Jackson, female   DOB: [birth year], 50 y.o.   MRN: [id number] Subjective: Kerri GlassingMary Jackson is a 50 y.o. female patient seen today in office for POV #1 (DOS 08-26-15), S/P left EPF with heel spur resection, Patient denies pain at surgical site, denies calf pain, denies headache, chest pain, shortness of breath, nausea, vomiting, fever, or chills. Patient states that she was able to tolerate the Dilaudid better in the Nor[place] and desires refill of this and stronger anti-inflammatory. No other issues noted.   There are no active problems to display for this patient.   Current Outpatient Prescriptions on File Prior to Visit  Medication Sig Dispense Refill  . aspirin EC 81 MG tablet Take 81 mg by mouth.    . butalbital-acetaminophen-caffeine (FIORICET, ESGIC) 50-325-40 MG tablet Take by mouth.    . celecoxib (CELEBREX) 200 MG capsule Take one capsule by mouth two times daily 180 capsule 0  . diclofenac (VOLTAREN) 75 MG EC tablet Take 1 tablet (75 mg total) by mouth 2 (two) times daily. 30 tablet 0  . meloxicam (MOBIC) 15 MG tablet Take 1 tablet (15 mg total) by mouth daily. 30 tablet 0  . methylPREDNISolone (MEDROL DOSEPAK) 4 MG TBPK tablet Take as instructed 21 tablet 0  . metoprolol succinate (TOPROL-XL) 100 MG 24 hr tablet     . oxyCODONE-acetaminophen (PERCOCET) 7.5-325 MG tablet Take 1 tablet by mouth every 8 (eight) hours as needed for severe pain. 60 tablet 0  . pantoprazole (PROTONIX) 40 MG tablet Take 40 mg by mouth.    . polyethylene glycol powder (GLYCOLAX/MIRALAX) powder     . rosuvastatin (CRESTOR) 10 MG tablet Take 10 mg by mouth.    . rosuvastatin (CRESTOR) 20 MG tablet     . verapamil (CALAN-SR) 120 MG CR tablet      Current Facility-Administered Medications on File Prior to Visit  Medication Dose Route Frequency Provider Last Rate Last Dose  . triamcinolone acetonide (KENALOG) 10 MG/ML injection 10 mg  10 mg Other Once [employer] Iver Miklas, DPM        No Known  Allergies  Objective: There were no vitals filed for this visit.  General: No acute distress, AAOx3  Left foot: Sutures intact with no gapping or dehiscence at surgical site, mild swelling to left heel, no erythema, no warmth, no drainage, no signs of infection noted, Capillary fill time <3 seconds in all digits, gross sensation present via light touch to left foot. No pain or crepitation with range of motion left foot.  No pain with calf compression.   Assessment and Plan:  Problem List Items Addressed This Visit    None    Visit Diagnoses    Post-operative pain    -  Primary    Relevant Medications    ibuprofen (ADVIL,MOTRIN) 800 MG tablet    HYDROmorphone (DILAUDID) 2 MG tablet    Status post left foot surgery        Left EPF and heel spur resection 08/26/2015        -Patient seen and evaluated -Applied dry sterile dressing to surgical site left foot secured with ACE wrap and stockinet  -Advised patient to make sure to keep dressings clean, dry, and intact to left surgical site, removing the ACE as needed  -Advised patient to continue with post-op shoe on left foot  with crutches as needed -Advised patient to limit activity to necessity  -Advised patient to ice and elevate as necessary  -Refilled Dilaudid and gave a  prescription for Motrin 800 mg to take daily as needed -Will plan for suture removal at next office visit. In the meantime, patient to call office if any issues or problems arise.   [name], DPM

## 2015-09-11 ENCOUNTER — Encounter: Payer: Self-pay | Admitting: Sports Medicine

## 2015-09-12 ENCOUNTER — Ambulatory Visit (INDEPENDENT_AMBULATORY_CARE_PROVIDER_SITE_OTHER): Payer: PRIVATE HEALTH INSURANCE | Admitting: Sports Medicine

## 2015-09-12 ENCOUNTER — Encounter: Payer: Self-pay | Admitting: Sports Medicine

## 2015-09-12 DIAGNOSIS — Z9889 Other specified postprocedural states: Secondary | ICD-10-CM

## 2015-09-12 DIAGNOSIS — G8918 Other acute postprocedural pain: Secondary | ICD-10-CM

## 2015-09-12 NOTE — Progress Notes (Signed)
Patient ID: Kerri Jackson, female   DOB: January 26, 1966, 50 y.o.   MRN: [id number]   Subjective: Kerri GlassingMary Amore is a 50 y.o. female patient seen today in office for POV #2 (DOS 08-26-15), S/P left EPF with heel spur resection, Patient admits to pain at surgical site when she is trying to walk on her heel; has been using crutches and admits that she is afraid to walk, denies calf pain, denies headache, chest pain, shortness of breath, nausea, vomiting, fever, or chills. Patient states that she hasn't taken any pain medicines over the last 3 days,thus she is doing better from this aspect, No other issues noted.   There are no active problems to display for this patient.   Current Outpatient Prescriptions on File Prior to Visit  Medication Sig Dispense Refill  . aspirin EC 81 MG tablet Take 81 mg by mouth.    . butalbital-acetaminophen-caffeine (FIORICET, ESGIC) 50-325-40 MG tablet Take by mouth.    . celecoxib (CELEBREX) 200 MG capsule Take one capsule by mouth two times daily 180 capsule 0  . diclofenac (VOLTAREN) 75 MG EC tablet Take 1 tablet (75 mg total) by mouth 2 (two) times daily. 30 tablet 0  . HYDROmorphone (DILAUDID) 2 MG tablet Take 1 tablet (2 mg total) by mouth every 6 (six) hours as needed for moderate pain or severe pain. 30 tablet 0  . ibuprofen (ADVIL,MOTRIN) 800 MG tablet Take i tab twice daily 30 tablet 0  . meloxicam (MOBIC) 15 MG tablet Take 1 tablet (15 mg total) by mouth daily. 30 tablet 0  . methylPREDNISolone (MEDROL DOSEPAK) 4 MG TBPK tablet Take as instructed 21 tablet 0  . metoprolol succinate (TOPROL-XL) 100 MG 24 hr tablet     . oxyCODONE-acetaminophen (PERCOCET) 7.5-325 MG tablet Take 1 tablet by mouth every 8 (eight) hours as needed for severe pain. 60 tablet 0  . pantoprazole (PROTONIX) 40 MG tablet Take 40 mg by mouth.    . polyethylene glycol powder (GLYCOLAX/MIRALAX) powder     . rosuvastatin (CRESTOR) 10 MG tablet Take 10 mg by mouth.    . rosuvastatin (CRESTOR) 20 MG  tablet     . verapamil (CALAN-SR) 120 MG CR tablet      Current Facility-Administered Medications on File Prior to Visit  Medication Dose Route Frequency Provider Last Rate Last Dose  . triamcinolone acetonide (KENALOG) 10 MG/ML injection 10 mg  10 mg Other Once [employer] Paytyn Mesta, DPM        No Known Allergies  Objective: There were no vitals filed for this visit.  General: No acute distress, AAOx3  Left foot: Sutures intact with no gapping or dehiscence at surgical site, mild swelling to left heel, no erythema, no warmth, no drainage, no signs of infection noted, Capillary fill time <3 seconds in all digits, gross sensation present via light touch to left foot. No pain or crepitation with range of motion left foot.  No pain with calf compression.   Assessment and Plan:  Problem List Items Addressed This Visit    None    Visit Diagnoses    Status post left foot surgery    -  Primary    Left EPF with heel spur resection 08/26/2015    Post-operative pain            -Patient seen and evaluated -Sutures removed and applied Steri-Strips, Ace wrap, left foot -Patient may shower as normal and remove Ace as needed -Advised patient to transition from postop shoe to good  supportive sneaker and to wean from use of crutches -Advised patient to progress to activity as tolerated -Advised patient to ice and elevate as necessary and to start home physical therapy; patient states that she cannot afford  formal sessions of physical therapy at this time. However, I advised patient to do home physical therapy as I instructed -Continue with PRN meds as needed -Work note; continue with no work for the time being; will evaluate return to work status at next visit -Patient to return to office in 2 weeks for reevaluation. In the meantime, patient to call office if any issues or problems arise.   [name], DPM

## 2015-09-26 ENCOUNTER — Encounter: Payer: Self-pay | Admitting: Sports Medicine

## 2015-09-26 ENCOUNTER — Ambulatory Visit (INDEPENDENT_AMBULATORY_CARE_PROVIDER_SITE_OTHER): Payer: PRIVATE HEALTH INSURANCE | Admitting: Sports Medicine

## 2015-09-26 DIAGNOSIS — G8918 Other acute postprocedural pain: Secondary | ICD-10-CM

## 2015-09-26 DIAGNOSIS — Z9889 Other specified postprocedural states: Secondary | ICD-10-CM

## 2015-09-26 MED ORDER — IBUPROFEN-FAMOTIDINE 800-26.6 MG PO TABS: ORAL_TABLET | ORAL | Status: DC

## 2015-09-26 NOTE — Progress Notes (Signed)
Patient ID: Kerri Jackson, female   DOB: 24-Jan-1966, 50 y.o.   MRN: [id number]  Subjective: Kerri Jackson is a 50 y.o. female patient seen today in office for POV #3 (DOS 08-26-15), S/P left EPF with heel spur resection, Patient admits to pain at surgical site when she is trying to walk normal heel to toe that is better than last visit however, still present; denies headache, chest pain, shortness of breath, nausea, vomiting, fever, or chills. Patient states that she has stopped pain medication, No other issues noted.   There are no active problems to display for this patient.   Current Outpatient Prescriptions on File Prior to Visit  Medication Sig Dispense Refill  . aspirin EC 81 MG tablet Take 81 mg by mouth.    . butalbital-acetaminophen-caffeine (FIORICET, ESGIC) 50-325-40 MG tablet Take by mouth.    . celecoxib (CELEBREX) 200 MG capsule Take one capsule by mouth two times daily 180 capsule 0  . diclofenac (VOLTAREN) 75 MG EC tablet Take 1 tablet (75 mg total) by mouth 2 (two) times daily. 30 tablet 0  . HYDROmorphone (DILAUDID) 2 MG tablet Take 1 tablet (2 mg total) by mouth every 6 (six) hours as needed for moderate pain or severe pain. 30 tablet 0  . ibuprofen (ADVIL,MOTRIN) 800 MG tablet Take i tab twice daily 30 tablet 0  . meloxicam (MOBIC) 15 MG tablet Take 1 tablet (15 mg total) by mouth daily. 30 tablet 0  . methylPREDNISolone (MEDROL DOSEPAK) 4 MG TBPK tablet Take as instructed 21 tablet 0  . metoprolol succinate (TOPROL-XL) 100 MG 24 hr tablet     . oxyCODONE-acetaminophen (PERCOCET) 7.5-325 MG tablet Take 1 tablet by mouth every 8 (eight) hours as needed for severe pain. 60 tablet 0  . pantoprazole (PROTONIX) 40 MG tablet Take 40 mg by mouth.    . polyethylene glycol powder (GLYCOLAX/MIRALAX) powder     . rosuvastatin (CRESTOR) 10 MG tablet Take 10 mg by mouth.    . rosuvastatin (CRESTOR) 20 MG tablet     . verapamil (CALAN-SR) 120 MG CR tablet      Current Facility-Administered  Medications on File Prior to Visit  Medication Dose Route Frequency Provider Last Rate Last Dose  . triamcinolone acetonide (KENALOG) 10 MG/ML injection 10 mg  10 mg Other Once [employer], DPM        No Known Allergies  Objective: There were no vitals filed for this visit.  General: No acute distress, AAOx3  Left foot: Incision sites are healed with no dehiscence or gap, mild swelling to left heel, no erythema, no warmth, no drainage, no signs of infection noted, Capillary fill time <3 seconds in all digits, gross sensation present via light touch to left foot. No pain or crepitation with range of motion left foot.  No pain with calf compression. Subjective pain with heel to toe ambulation.   Assessment and Plan:  Problem List Items Addressed This Visit    None    Visit Diagnoses    Status post left foot surgery    -  Primary    Post-operative pain        Relevant Medications    Ibuprofen-Famotidine 800-26.6 MG TABS        -Patient seen and evaluated -Advised patient to use Ace wrap as needed for edema control -Continue with good supportive sneaker -Advised patient to progress to activity as tolerated -Advised patient to ice and elevate as necessary and to continue home PT; advised patient  that she will have to reeducate herself how to walk again appropriately heel to toe.  -Continue with PRN meds as needed; Prescribed Enteric-coated Motrin to take as needed for pain and inflammation -Work note: return to work on 10/07/2015, regular hours, no restriction -Patient to return to office in 3-4 weeks for reevaluation. In the meantime, patient to call office if any issues or problems arise.   Asencion Islamitorya Velton Roselle, DPM

## 2015-10-02 ENCOUNTER — Encounter: Payer: Self-pay | Admitting: Sports Medicine

## 2015-10-17 ENCOUNTER — Encounter: Payer: Self-pay | Admitting: Sports Medicine

## 2015-10-17 ENCOUNTER — Other Ambulatory Visit: Payer: Self-pay | Admitting: Sports Medicine

## 2015-10-17 ENCOUNTER — Ambulatory Visit (INDEPENDENT_AMBULATORY_CARE_PROVIDER_SITE_OTHER): Payer: PRIVATE HEALTH INSURANCE | Admitting: Sports Medicine

## 2015-10-17 DIAGNOSIS — Z9889 Other specified postprocedural states: Secondary | ICD-10-CM

## 2015-10-17 DIAGNOSIS — M79672 Pain in left foot: Secondary | ICD-10-CM

## 2015-10-17 DIAGNOSIS — G8918 Other acute postprocedural pain: Secondary | ICD-10-CM

## 2015-10-17 MED ORDER — IBUPROFEN-FAMOTIDINE 800-26.6 MG PO TABS: ORAL_TABLET | ORAL | Status: DC

## 2015-10-17 MED ORDER — IBUPROFEN 800 MG PO TABS: ORAL_TABLET | ORAL | Status: DC

## 2015-10-17 MED ORDER — FAMOTIDINE 20 MG PO TABS: 20.0000 mg | ORAL_TABLET | Freq: Two times a day (BID) | ORAL | Status: DC

## 2015-10-17 NOTE — Progress Notes (Signed)
Patient ID: Kerri Jackson, female   DOB: [birth year], 50 y.o.   MRN: [id number]   Subjective: Kerri Jackson is a 50 y.o. female patient seen today in office for POV #4 (DOS 08-26-15), S/P left EPF with heel spur resection, Patient admits to pain at surgical site when she is trying to walk normal heel to toe that is better since last visit however worse on days when she is at work going up and down on ladder, Reports that she is also getting sharp pain to left big toe more now that may be secondary to pain from standing on her toes; denies headache, chest pain, shortness of breath, nausea, vomiting, fever, or chills. No other issues noted.   There are no active problems to display for this patient.   Current Outpatient Prescriptions on File Prior to Visit  Medication Sig Dispense Refill  . aspirin EC 81 MG tablet Take 81 mg by mouth.    . butalbital-acetaminophen-caffeine (FIORICET, ESGIC) 50-325-40 MG tablet Take by mouth.    . celecoxib (CELEBREX) 200 MG capsule Take one capsule by mouth two times daily 180 capsule 0  . diclofenac (VOLTAREN) 75 MG EC tablet Take 1 tablet (75 mg total) by mouth 2 (two) times daily. 30 tablet 0  . HYDROmorphone (DILAUDID) 2 MG tablet Take 1 tablet (2 mg total) by mouth every 6 (six) hours as needed for moderate pain or severe pain. 30 tablet 0  . ibuprofen (ADVIL,MOTRIN) 800 MG tablet Take i tab twice daily 30 tablet 0  . Ibuprofen-Famotidine 800-26.6 MG TABS Take one tab by mouth twice daily as needed for pain and inflammation 90 tablet 1  . meloxicam (MOBIC) 15 MG tablet Take 1 tablet (15 mg total) by mouth daily. 30 tablet 0  . methylPREDNISolone (MEDROL DOSEPAK) 4 MG TBPK tablet Take as instructed 21 tablet 0  . metoprolol succinate (TOPROL-XL) 100 MG 24 hr tablet     . oxyCODONE-acetaminophen (PERCOCET) 7.5-325 MG tablet Take 1 tablet by mouth every 8 (eight) hours as needed for severe pain. 60 tablet 0  . pantoprazole (PROTONIX) 40 MG tablet Take 40 mg by mouth.     . polyethylene glycol powder (GLYCOLAX/MIRALAX) powder     . rosuvastatin (CRESTOR) 10 MG tablet Take 10 mg by mouth.    . rosuvastatin (CRESTOR) 20 MG tablet     . verapamil (CALAN-SR) 120 MG CR tablet      Current Facility-Administered Medications on File Prior to Visit  Medication Dose Route Frequency Provider Last Rate Last Dose  . triamcinolone acetonide (KENALOG) 10 MG/ML injection 10 mg  10 mg Other Once [employer] Brennyn Ortlieb, DPM        No Known Allergies  Objective: There were no vitals filed for this visit.  General: No acute distress, AAOx3  Left foot: Incision sites are healed, mild swelling to left heel, no erythema, no warmth, no drainage, no signs of infection noted, Capillary fill time <3 seconds in all digits, gross sensation present via light touch to left foot. No pain or crepitation with range of motion left foot.  No pain with calf compression. Subjective pain with heel to toe ambulation and abduction in gait.   Assessment and Plan:  Problem List Items Addressed This Visit    None    Visit Diagnoses    Status post left foot surgery    -  Primary    Left foot pain        Post-operative pain  Relevant Medications    Ibuprofen-Famotidine 800-26.6 MG TABS      -Patient seen and evaluated -Advised patient to return to using brace to help with pain especially when standing or at work and to ice during lunch break -Continue with good supportive sneaker -Advised patient to progress to activity as tolerated -Advised patient to ice and elevate as necessary and to continue home PT; advised patient to add some hip exercises to her therapy at to try to do at home therapy 2x daily -Continue with PRN meds as needed; Refilled Enteric-coated Motrin to take as needed for pain and inflammation -Work note: continue with regular hours, no restriction. Informed patient that disability may not be afforded since she is not completely impaired  -Patient to return to office in 8  weeks for reevaluation. In the meantime, patient to call office if any issues or problems arise.   [name], DPM

## 2015-12-18 ENCOUNTER — Encounter: Payer: Self-pay | Admitting: Sports Medicine

## 2015-12-18 ENCOUNTER — Ambulatory Visit (INDEPENDENT_AMBULATORY_CARE_PROVIDER_SITE_OTHER): Payer: PRIVATE HEALTH INSURANCE

## 2015-12-18 ENCOUNTER — Ambulatory Visit (INDEPENDENT_AMBULATORY_CARE_PROVIDER_SITE_OTHER): Payer: PRIVATE HEALTH INSURANCE | Admitting: Sports Medicine

## 2015-12-18 DIAGNOSIS — M79672 Pain in left foot: Secondary | ICD-10-CM

## 2015-12-18 DIAGNOSIS — Z9889 Other specified postprocedural states: Secondary | ICD-10-CM

## 2015-12-18 DIAGNOSIS — G8918 Other acute postprocedural pain: Secondary | ICD-10-CM

## 2015-12-18 MED ORDER — IBUPROFEN 800 MG PO TABS: ORAL_TABLET | ORAL | 2 refills | Status: DC

## 2015-12-18 NOTE — Progress Notes (Signed)
Patient ID: Kerri Jackson, female   DOB: 1965-07-29, 50 y.o.   MRN: [id number]   Subjective: Kerri Jackson is a 50 y.o. female patient seen today in office for POV #5 (DOS 08-26-15), S/P left EPF with heel spur resection, Patient states that her pain is much better and that she is starting to feel normal again occasionally has a little bit of pain in the left heel after periods of long standing and walking associated with work. However, she is much better as compared to before surgery. No other issues noted.   There are no active problems to display for this patient.   Current Outpatient Prescriptions on File Prior to Visit  Medication Sig Dispense Refill  . aspirin EC 81 MG tablet Take 81 mg by mouth.    . butalbital-acetaminophen-caffeine (FIORICET, ESGIC) 50-325-40 MG tablet Take by mouth.    . celecoxib (CELEBREX) 200 MG capsule Take one capsule by mouth two times daily 180 capsule 0  . diclofenac (VOLTAREN) 75 MG EC tablet Take 1 tablet (75 mg total) by mouth 2 (two) times daily. 30 tablet 0  . famotidine (PEPCID) 20 MG tablet Take 1 tablet (20 mg total) by mouth 2 (two) times daily. 90 tablet 2  . HYDROmorphone (DILAUDID) 2 MG tablet Take 1 tablet (2 mg total) by mouth every 6 (six) hours as needed for moderate pain or severe pain. 30 tablet 0  . ibuprofen (ADVIL,MOTRIN) 800 MG tablet Take i tab twice daily 90 tablet 2  . Ibuprofen-Famotidine 800-26.6 MG TABS Take one tab by mouth twice daily as needed for pain and inflammation 90 tablet 2  . meloxicam (MOBIC) 15 MG tablet Take 1 tablet (15 mg total) by mouth daily. 30 tablet 0  . methylPREDNISolone (MEDROL DOSEPAK) 4 MG TBPK tablet Take as instructed 21 tablet 0  . metoprolol succinate (TOPROL-XL) 100 MG 24 hr tablet     . oxyCODONE-acetaminophen (PERCOCET) 7.5-325 MG tablet Take 1 tablet by mouth every 8 (eight) hours as needed for severe pain. 60 tablet 0  . pantoprazole (PROTONIX) 40 MG tablet Take 40 mg by mouth.    . polyethylene glycol  powder (GLYCOLAX/MIRALAX) powder     . rosuvastatin (CRESTOR) 10 MG tablet Take 10 mg by mouth.    . rosuvastatin (CRESTOR) 20 MG tablet     . verapamil (CALAN-SR) 120 MG CR tablet      Current Facility-Administered Medications on File Prior to Visit  Medication Dose Route Frequency Provider Last Rate Last Dose  . triamcinolone acetonide (KENALOG) 10 MG/ML injection 10 mg  10 mg Other Once [employer], DPM        No Known Allergies  Objective: There were no vitals filed for this visit.  General: No acute distress, AAOx3  Left foot: Incision sites are well healed, no swelling to left heel, no erythema, no warmth, no drainage, no signs of infection noted, Capillary fill time <3 seconds in all digits, gross sensation present via light touch to left foot. No pain or crepitation with range of motion left foot.  No pain with calf compression. No pain with heel to toe ambulation and continued mild abduction in gait.   X-rays left foot: Normal osseous mineralization, hardware intact first metatarsal, minimal spur present. Status post plantar fascial release, no acute fracture, dislocation or acute findings.  Assessment and Plan:  Problem List Items Addressed This Visit    None    Visit Diagnoses    Status post left foot surgery    -  Primary   Left foot pain       Post-operative pain         -Patient seen and evaluated -X-rays reviewed -Patient is doing well and has made significant functional improvement -Continue with good supportive sneaker -Advised patient to continue with activities as tolerated -Continue with PRN meds as needed; Refilled Motrin to take as needed for pain and inflammation -Continue work regular duty, no restrictions -Patient to return to office in as needed for reevaluation. In the meantime, patient to call office if any issues or problems arise. In my opinion. Patient has greatly improved and is considered fully healed and discharged from all postoperative  care.  Kerri Jackson, DPM

## 2016-01-16 NOTE — Progress Notes (Signed)
DOS 08/26/2015 open vs endoscopic plantar fascial release (medial band) with removal of heel spur left foot.

## 2016-07-22 ENCOUNTER — Encounter: Payer: Self-pay | Admitting: Sports Medicine

## 2016-07-22 ENCOUNTER — Ambulatory Visit (INDEPENDENT_AMBULATORY_CARE_PROVIDER_SITE_OTHER): Payer: BLUE CROSS/BLUE SHIELD | Admitting: Sports Medicine

## 2016-07-22 ENCOUNTER — Telehealth: Payer: Self-pay | Admitting: *Deleted

## 2016-07-22 DIAGNOSIS — S8261XA Displaced fracture of lateral malleolus of right fibula, initial encounter for closed fracture: Secondary | ICD-10-CM

## 2016-07-22 DIAGNOSIS — M25571 Pain in right ankle and joints of right foot: Secondary | ICD-10-CM

## 2016-07-22 MED ORDER — OXYCODONE-ACETAMINOPHEN 7.5-325 MG PO TABS: 1.0000 | ORAL_TABLET | Freq: Three times a day (TID) | ORAL | 0 refills | Status: DC | PRN

## 2016-07-22 NOTE — Telephone Encounter (Signed)
Pt states the Mobic is not helping the pain.

## 2016-07-22 NOTE — Telephone Encounter (Signed)
She came by office and I gave her Percocet

## 2016-07-22 NOTE — Progress Notes (Signed)
Subjective: Kerri Jackson is a 51 y.o. female patient who presents to office for evaluation of Right ankle pain after trip and fall on 07-20-16 went to Urgent care where they gave boot and crutches and mobic. Patient complains of continued pain. Patient denies any other pedal complaints.   Patient Active Problem List   Diagnosis Date Noted  . Wrist pain 03/13/2013    Current Outpatient Prescriptions on File Prior to Visit  Medication Sig Dispense Refill  . aspirin EC 81 MG tablet Take 81 mg by mouth.    . butalbital-acetaminophen-caffeine (FIORICET, ESGIC) 50-325-40 MG tablet Take by mouth.    . butalbital-acetaminophen-caffeine (FIORICET, ESGIC) 50-325-40 MG tablet Take by mouth.    . celecoxib (CELEBREX) 200 MG capsule Take one capsule by mouth two times daily 180 capsule 0  . diclofenac (VOLTAREN) 75 MG EC tablet Take 1 tablet (75 mg total) by mouth 2 (two) times daily. 30 tablet 0  . docusate sodium (COLACE) 100 MG capsule Take 100 mg by mouth 2 (two) times daily.    Marland Kitchen doxycycline (VIBRAMYCIN) 100 MG capsule     . famotidine (PEPCID) 20 MG tablet Take 1 tablet (20 mg total) by mouth 2 (two) times daily. 90 tablet 2  . HYDROcodone-acetaminophen (NORCO) 10-325 MG tablet Take 1 tablet by mouth every 6 (six) hours as needed.    Marland Kitchen HYDROmorphone (DILAUDID) 2 MG tablet Take 1 tablet (2 mg total) by mouth every 6 (six) hours as needed for moderate pain or severe pain. 30 tablet 0  . ibuprofen (ADVIL,MOTRIN) 800 MG tablet Take i tab twice daily 90 tablet 2  . Ibuprofen-Famotidine 800-26.6 MG TABS Take one tab by mouth twice daily as needed for pain and inflammation 90 tablet 2  . meloxicam (MOBIC) 15 MG tablet Take 1 tablet (15 mg total) by mouth daily. 30 tablet 0  . methocarbamol (ROBAXIN) 500 MG tablet     . methylPREDNISolone (MEDROL DOSEPAK) 4 MG TBPK tablet Take as instructed 21 tablet 0  . pantoprazole (PROTONIX) 40 MG tablet Take 40 mg by mouth.    . polyethylene glycol powder  (GLYCOLAX/MIRALAX) powder     . promethazine (PHENERGAN) 25 MG tablet Take 25 mg by mouth every 6 (six) hours as needed for nausea or vomiting.    . rosuvastatin (CRESTOR) 10 MG tablet Take 10 mg by mouth.    . rosuvastatin (CRESTOR) 20 MG tablet      Current Facility-Administered Medications on File Prior to Visit  Medication Dose Route Frequency Provider Last Rate Last Dose  . triamcinolone acetonide (KENALOG) 10 MG/ML injection 10 mg  10 mg Other Once [employer], DPM        No Known Allergies  Objective:  General: Alert and oriented x3 in no acute distress  Dermatology: No open lesions bilateral lower extremities, no webspace macerations, no ecchymosis bilateral, all nails x 10 are well manicured.  Vascular: Focal edema noted to right/left foot. Dorsalis Pedis and Posterior Tibial pedal pulses 2/4, Capillary Fill Time 3 seconds,(+) pedal hair growth bilateral, Temperature gradient within normal limits.  Neurology: Michaell Cowing sensation intact via light touch bilateral.  Musculoskeletal: There is tenderness with palpation at lateral ankle and distal fibula on right ankle, Strength within normal limits in all groups bilateral except on right where there is guarding.   Xrays from urgent care reviewed of right foot and ankle revealing avulsion fracture 1mm gap at distal fibula and chronic heel spur. No other acute findings.     Assessment  and Plan: Problem List Items Addressed This Visit    None    Visit Diagnoses    Closed displaced fracture of lateral malleolus of right fibula, initial encounter    -  Primary   Acute right ankle pain       R   Relevant Medications   oxyCODONE-acetaminophen (PERCOCET) 7.5-325 MG tablet       -Complete examination performed -Xrays reviewed -Discussed treatement options for fracture; risks, alternatives, and benefits explained. -Applied sugritube compression  -Continue with CAM walker to patient to wear at all times and cutches -Recommend  protection, rest, ice, elevation daily until symptoms improve -Rx Percocet once for severe pain  -Recommend no work until fracture heals if job can not accommodate will complete FMLA  -Patient to return to office in 4 weeks for serial x-rays to assess healing  or sooner if condition worsens.  Kerri Jackson, DPM

## 2016-08-20 ENCOUNTER — Ambulatory Visit: Payer: BLUE CROSS/BLUE SHIELD

## 2016-08-20 ENCOUNTER — Ambulatory Visit (INDEPENDENT_AMBULATORY_CARE_PROVIDER_SITE_OTHER): Payer: BLUE CROSS/BLUE SHIELD | Admitting: Sports Medicine

## 2016-08-20 DIAGNOSIS — M25571 Pain in right ankle and joints of right foot: Secondary | ICD-10-CM | POA: Diagnosis not present

## 2016-08-20 DIAGNOSIS — S8261XD Displaced fracture of lateral malleolus of right fibula, subsequent encounter for closed fracture with routine healing: Secondary | ICD-10-CM

## 2016-08-20 MED ORDER — IBUPROFEN 800 MG PO TABS: 800.0000 mg | ORAL_TABLET | Freq: Three times a day (TID) | ORAL | 0 refills | Status: DC | PRN

## 2016-08-20 NOTE — Progress Notes (Signed)
Subjective: Kerri Jackson is a 51 y.o. female patient who returns to office for follow up evaluation of Right ankle pain after trip and fall on 07-20-16. Patient has ankle fracture and reports that pain is getting a little better. Patient denies any other pedal complaints.   Patient Active Problem List   Diagnosis Date Noted  . Wrist pain 03/13/2013    Current Outpatient Prescriptions on File Prior to Visit  Medication Sig Dispense Refill  . amoxicillin-clavulanate (AUGMENTIN) 875-125 MG tablet     . aspirin EC 81 MG tablet Take 81 mg by mouth.    . butalbital-acetaminophen-caffeine (FIORICET, ESGIC) 50-325-40 MG tablet Take by mouth.    . butalbital-acetaminophen-caffeine (FIORICET, ESGIC) 50-325-40 MG tablet Take by mouth.    . celecoxib (CELEBREX) 200 MG capsule Take one capsule by mouth two times daily 180 capsule 0  . DEXILANT 60 MG capsule     . diclofenac (VOLTAREN) 75 MG EC tablet Take 1 tablet (75 mg total) by mouth 2 (two) times daily. 30 tablet 0  . docusate sodium (COLACE) 100 MG capsule Take 100 mg by mouth 2 (two) times daily.    Marland Kitchen doxycycline (VIBRAMYCIN) 100 MG capsule     . famotidine (PEPCID) 20 MG tablet Take 1 tablet (20 mg total) by mouth 2 (two) times daily. 90 tablet 2  . fluticasone (FLONASE) 50 MCG/ACT nasal spray     . HYDROcodone-acetaminophen (NORCO) 10-325 MG tablet Take 1 tablet by mouth every 6 (six) hours as needed.    Marland Kitchen HYDROmorphone (DILAUDID) 2 MG tablet Take 1 tablet (2 mg total) by mouth every 6 (six) hours as needed for moderate pain or severe pain. 30 tablet 0  . ibuprofen (ADVIL,MOTRIN) 800 MG tablet Take i tab twice daily 90 tablet 2  . Ibuprofen-Famotidine 800-26.6 MG TABS Take one tab by mouth twice daily as needed for pain and inflammation 90 tablet 2  . losartan (COZAAR) 50 MG tablet     . meloxicam (MOBIC) 15 MG tablet Take 1 tablet (15 mg total) by mouth daily. 30 tablet 0  . meloxicam (MOBIC) 7.5 MG tablet     . methocarbamol (ROBAXIN) 500 MG  tablet     . methylPREDNISolone (MEDROL DOSEPAK) 4 MG TBPK tablet Take as instructed 21 tablet 0  . metoprolol succinate (TOPROL-XL) 50 MG 24 hr tablet     . metroNIDAZOLE (METROGEL) 0.75 % vaginal gel     . oxyCODONE-acetaminophen (PERCOCET) 7.5-325 MG tablet Take 1 tablet by mouth every 8 (eight) hours as needed for severe pain. 20 tablet 0  . pantoprazole (PROTONIX) 40 MG tablet Take 40 mg by mouth.    . polyethylene glycol powder (GLYCOLAX/MIRALAX) powder     . prochlorperazine (COMPAZINE) 10 MG tablet     . PROCTOFOAM HC rectal foam     . promethazine (PHENERGAN) 25 MG tablet Take 25 mg by mouth every 6 (six) hours as needed for nausea or vomiting.    . rosuvastatin (CRESTOR) 10 MG tablet Take 10 mg by mouth.    . rosuvastatin (CRESTOR) 20 MG tablet     . verapamil (CALAN) 120 MG tablet      Current Facility-Administered Medications on File Prior to Visit  Medication Dose Route Frequency Provider Last Rate Last Dose  . triamcinolone acetonide (KENALOG) 10 MG/ML injection 10 mg  10 mg Other Once [employer], DPM        No Known Allergies  Objective:  General: Alert and oriented x3 in no acute  distress  Dermatology: No open lesions bilateral lower extremities, no webspace macerations, no ecchymosis bilateral, all nails x 10 are well manicured.  Vascular: Focal edema noted to right lateral ankle. Dorsalis Pedis and Posterior Tibial pedal pulses 2/4, Capillary Fill Time 3 seconds,(+) pedal hair growth bilateral, Temperature gradient within normal limits.  Neurology: Michaell CowingGross sensation intact via light touch bilateral.  Musculoskeletal: There is tenderness with palpation at lateral ankle and distal fibula on right ankle, Strength within normal limits in all groups bilateral except on right where there is guarding.   Xrays, right foot and ankle revealing avulsion fracture 1mm gap at distal fibula and chronic heel spur. No other acute findings.     Assessment and Plan: Problem List  Items Addressed This Visit    None    Visit Diagnoses    Closed displaced fracture of lateral malleolus of right fibula with routine healing, subsequent encounter    -  Primary   Relevant Orders   DG Ankle Complete Right   Acute right ankle pain           -Complete examination performed -Xrays reviewed -Discussed treatement options for fracture; risks, alternatives, and benefits explained. -Applied sugritube compression  -Continue with CAM walker as instructed and cutches -Recommend continue with protection, rest, ice, elevation daily until symptoms improve -Recommend continue with no work until fracture is healed -Patient to return to office in 4 weeks for serial x-rays to assess healing  or sooner if condition worsens.  Asencion Islamitorya Romin Divita, DPM

## 2016-09-02 ENCOUNTER — Telehealth: Payer: Self-pay | Admitting: *Deleted

## 2016-09-02 NOTE — Telephone Encounter (Signed)
Cherie Dark needs diagnosis of ankle for 07/22/2016 and follow up visit 08/20/2016 for Claim# [id number].

## 2016-09-25 ENCOUNTER — Encounter: Payer: Self-pay | Admitting: Sports Medicine

## 2016-09-25 ENCOUNTER — Ambulatory Visit (INDEPENDENT_AMBULATORY_CARE_PROVIDER_SITE_OTHER): Payer: BLUE CROSS/BLUE SHIELD | Admitting: Sports Medicine

## 2016-09-25 ENCOUNTER — Telehealth: Payer: Self-pay | Admitting: Sports Medicine

## 2016-09-25 ENCOUNTER — Ambulatory Visit (INDEPENDENT_AMBULATORY_CARE_PROVIDER_SITE_OTHER): Payer: BLUE CROSS/BLUE SHIELD

## 2016-09-25 DIAGNOSIS — S8261XD Displaced fracture of lateral malleolus of right fibula, subsequent encounter for closed fracture with routine healing: Secondary | ICD-10-CM

## 2016-09-25 DIAGNOSIS — M25571 Pain in right ankle and joints of right foot: Secondary | ICD-10-CM | POA: Diagnosis not present

## 2016-09-25 NOTE — Telephone Encounter (Signed)
Pt just left the office and is needing a note faxed reguarding restrictions for work. Please call pt and she can clarify what is needed. Fax to [employer] at [phone]

## 2016-09-26 NOTE — Progress Notes (Signed)
Subjective: Kerri Jackson is a 51 y.o. female patient who returns to office for follow up evaluation of Right ankle pain after trip and fall on 07-20-16. Patient has ankle fracture and reports that pain is better and is ready to return to work. Patient denies any other pedal complaints.   Patient Active Problem List   Diagnosis Date Noted  . Wrist pain 03/13/2013    Current Outpatient Prescriptions on File Prior to Visit  Medication Sig Dispense Refill  . amoxicillin-clavulanate (AUGMENTIN) 875-125 MG tablet     . aspirin EC 81 MG tablet Take 81 mg by mouth.    . butalbital-acetaminophen-caffeine (FIORICET, ESGIC) 50-325-40 MG tablet Take by mouth.    . butalbital-acetaminophen-caffeine (FIORICET, ESGIC) 50-325-40 MG tablet Take by mouth.    . celecoxib (CELEBREX) 200 MG capsule Take one capsule by mouth two times daily 180 capsule 0  . DEXILANT 60 MG capsule     . diclofenac (VOLTAREN) 75 MG EC tablet Take 1 tablet (75 mg total) by mouth 2 (two) times daily. 30 tablet 0  . docusate sodium (COLACE) 100 MG capsule Take 100 mg by mouth 2 (two) times daily.    Marland Kitchen. doxycycline (VIBRAMYCIN) 100 MG capsule     . famotidine (PEPCID) 20 MG tablet Take 1 tablet (20 mg total) by mouth 2 (two) times daily. 90 tablet 2  . fluticasone (FLONASE) 50 MCG/ACT nasal spray     . HYDROcodone-acetaminophen (NORCO) 10-325 MG tablet Take 1 tablet by mouth every 6 (six) hours as needed.    Marland Kitchen. HYDROmorphone (DILAUDID) 2 MG tablet Take 1 tablet (2 mg total) by mouth every 6 (six) hours as needed for moderate pain or severe pain. 30 tablet 0  . ibuprofen (ADVIL,MOTRIN) 800 MG tablet Take 1 tablet (800 mg total) by mouth every 8 (eight) hours as needed. 30 tablet 0  . Ibuprofen-Famotidine 800-26.6 MG TABS Take one tab by mouth twice daily as needed for pain and inflammation 90 tablet 2  . losartan (COZAAR) 50 MG tablet     . meloxicam (MOBIC) 15 MG tablet Take 1 tablet (15 mg total) by mouth daily. 30 tablet 0  . meloxicam  (MOBIC) 7.5 MG tablet     . methocarbamol (ROBAXIN) 500 MG tablet     . methylPREDNISolone (MEDROL DOSEPAK) 4 MG TBPK tablet Take as instructed 21 tablet 0  . metoprolol succinate (TOPROL-XL) 50 MG 24 hr tablet     . metroNIDAZOLE (METROGEL) 0.75 % vaginal gel     . oxyCODONE-acetaminophen (PERCOCET) 7.5-325 MG tablet Take 1 tablet by mouth every 8 (eight) hours as needed for severe pain. 20 tablet 0  . pantoprazole (PROTONIX) 40 MG tablet Take 40 mg by mouth.    . polyethylene glycol powder (GLYCOLAX/MIRALAX) powder     . prochlorperazine (COMPAZINE) 10 MG tablet     . PROCTOFOAM HC rectal foam     . promethazine (PHENERGAN) 25 MG tablet Take 25 mg by mouth every 6 (six) hours as needed for nausea or vomiting.    . rosuvastatin (CRESTOR) 10 MG tablet Take 10 mg by mouth.    . rosuvastatin (CRESTOR) 20 MG tablet     . verapamil (CALAN) 120 MG tablet      Current Facility-Administered Medications on File Prior to Visit  Medication Dose Route Frequency Provider Last Rate Last Dose  . triamcinolone acetonide (KENALOG) 10 MG/ML injection 10 mg  10 mg Other Once Asencion IslamStover, Versie Fleener, DPM        No  Known Allergies  Objective:  General: Alert and oriented x3 in no acute distress  Dermatology: No open lesions bilateral lower extremities, no webspace macerations, no ecchymosis bilateral, all nails x 10 are well manicured.  Vascular: Resolved edema noted to right lateral ankle. Dorsalis Pedis and Posterior Tibial pedal pulses 2/4, Capillary Fill Time 3 seconds,(+) pedal hair growth bilateral, Temperature gradient within normal limits.  Neurology: Michaell Cowing sensation intact via light touch bilateral.  Musculoskeletal: There is decreased tenderness with palpation at lateral ankle and no pain to distal fibula on right ankle, Strength within normal limits in all groups bilateral except on right where there is guarding.   Xrays, right foot and ankle revealing prematurely healed avulsion fracture at distal  fibula and chronic heel spur. No other acute findings.     Assessment and Plan: Problem List Items Addressed This Visit    None    Visit Diagnoses    Closed displaced fracture of lateral malleolus of right fibula with routine healing, subsequent encounter    -  Primary   Prematurely healed   Relevant Orders   DG Ankle Complete Right   Acute right ankle pain       improved       -Complete examination performed -Xrays reviewed -Discussed long term care after fracture; risks, alternatives, and benefits explained. -Continue with sugritube compression  -May return to normal shoes  -Recommend return to work 09-29-16 with the allowance of sitting during shift  -Patient also desires custom orthotics; Advised patient that we will check her orthotic coverage and possibly cast next visit  -Patient to return to office in 4 weeks for final xray to assess healing at right ankle fracture and for possible custom orthotics or sooner if condition worsens.  [name], DPM

## 2016-10-01 ENCOUNTER — Other Ambulatory Visit: Payer: Self-pay | Admitting: Sports Medicine

## 2016-10-01 DIAGNOSIS — S8261XD Displaced fracture of lateral malleolus of right fibula, subsequent encounter for closed fracture with routine healing: Secondary | ICD-10-CM

## 2016-10-01 DIAGNOSIS — M25571 Pain in right ankle and joints of right foot: Secondary | ICD-10-CM

## 2016-10-01 MED ORDER — IBUPROFEN 800 MG PO TABS: 800.0000 mg | ORAL_TABLET | Freq: Three times a day (TID) | ORAL | 0 refills | Status: DC | PRN

## 2016-10-01 NOTE — Progress Notes (Signed)
Refilled Motrin 800mg  for patient -Dr. Marylene LandStover

## 2016-10-21 ENCOUNTER — Ambulatory Visit (INDEPENDENT_AMBULATORY_CARE_PROVIDER_SITE_OTHER): Payer: BLUE CROSS/BLUE SHIELD | Admitting: Sports Medicine

## 2016-10-21 ENCOUNTER — Ambulatory Visit (INDEPENDENT_AMBULATORY_CARE_PROVIDER_SITE_OTHER): Payer: BLUE CROSS/BLUE SHIELD

## 2016-10-21 DIAGNOSIS — S8261XD Displaced fracture of lateral malleolus of right fibula, subsequent encounter for closed fracture with routine healing: Secondary | ICD-10-CM

## 2016-10-21 DIAGNOSIS — M722 Plantar fascial fibromatosis: Secondary | ICD-10-CM | POA: Diagnosis not present

## 2016-10-21 DIAGNOSIS — M25571 Pain in right ankle and joints of right foot: Secondary | ICD-10-CM | POA: Diagnosis not present

## 2016-10-21 DIAGNOSIS — M7661 Achilles tendinitis, right leg: Secondary | ICD-10-CM | POA: Diagnosis not present

## 2016-10-21 NOTE — Progress Notes (Signed)
Subjective: Kerri Jackson is a 51 y.o. female patient who returns to office for follow up evaluation of Right ankle pain after trip and fall on 07-20-16. Patient has ankle fracture and reports that she is back at work with no issues however there is pain at the back of the heel on the right and sometimes across the top of the foot. Patient denies any other pedal complaints.   Patient Active Problem List   Diagnosis Date Noted  . Wrist pain 03/13/2013    Current Outpatient Prescriptions on File Prior to Visit  Medication Sig Dispense Refill  . amoxicillin-clavulanate (AUGMENTIN) 875-125 MG tablet     . aspirin EC 81 MG tablet Take 81 mg by mouth.    . butalbital-acetaminophen-caffeine (FIORICET, ESGIC) 50-325-40 MG tablet Take by mouth.    . butalbital-acetaminophen-caffeine (FIORICET, ESGIC) 50-325-40 MG tablet Take by mouth.    . celecoxib (CELEBREX) 200 MG capsule Take one capsule by mouth two times daily 180 capsule 0  . DEXILANT 60 MG capsule     . diclofenac (VOLTAREN) 75 MG EC tablet Take 1 tablet (75 mg total) by mouth 2 (two) times daily. 30 tablet 0  . docusate sodium (COLACE) 100 MG capsule Take 100 mg by mouth 2 (two) times daily.    Marland Kitchen doxycycline (VIBRAMYCIN) 100 MG capsule     . famotidine (PEPCID) 20 MG tablet Take 1 tablet (20 mg total) by mouth 2 (two) times daily. 90 tablet 2  . fluticasone (FLONASE) 50 MCG/ACT nasal spray     . HYDROcodone-acetaminophen (NORCO) 10-325 MG tablet Take 1 tablet by mouth every 6 (six) hours as needed.    Marland Kitchen HYDROmorphone (DILAUDID) 2 MG tablet Take 1 tablet (2 mg total) by mouth every 6 (six) hours as needed for moderate pain or severe pain. 30 tablet 0  . ibuprofen (ADVIL,MOTRIN) 800 MG tablet Take 1 tablet (800 mg total) by mouth every 8 (eight) hours as needed. 30 tablet 0  . Ibuprofen-Famotidine 800-26.6 MG TABS Take one tab by mouth twice daily as needed for pain and inflammation 90 tablet 2  . losartan (COZAAR) 50 MG tablet     . meloxicam  (MOBIC) 15 MG tablet Take 1 tablet (15 mg total) by mouth daily. 30 tablet 0  . meloxicam (MOBIC) 7.5 MG tablet     . methocarbamol (ROBAXIN) 500 MG tablet     . methylPREDNISolone (MEDROL DOSEPAK) 4 MG TBPK tablet Take as instructed 21 tablet 0  . metoprolol succinate (TOPROL-XL) 50 MG 24 hr tablet     . metroNIDAZOLE (METROGEL) 0.75 % vaginal gel     . oxyCODONE-acetaminophen (PERCOCET) 7.5-325 MG tablet Take 1 tablet by mouth every 8 (eight) hours as needed for severe pain. 20 tablet 0  . pantoprazole (PROTONIX) 40 MG tablet Take 40 mg by mouth.    . polyethylene glycol powder (GLYCOLAX/MIRALAX) powder     . prochlorperazine (COMPAZINE) 10 MG tablet     . PROCTOFOAM HC rectal foam     . promethazine (PHENERGAN) 25 MG tablet Take 25 mg by mouth every 6 (six) hours as needed for nausea or vomiting.    . rosuvastatin (CRESTOR) 10 MG tablet Take 10 mg by mouth.    . rosuvastatin (CRESTOR) 20 MG tablet     . verapamil (CALAN) 120 MG tablet      Current Facility-Administered Medications on File Prior to Visit  Medication Dose Route Frequency Provider Last Rate Last Dose  . triamcinolone acetonide (KENALOG) 10 MG/ML  injection 10 mg  10 mg Other Once Landis Martins, DPM        No Known Allergies  Objective:  General: Alert and oriented x3 in no acute distress  Dermatology: No open lesions bilateral lower extremities, no webspace macerations, no ecchymosis bilateral, all nails x 10 are well manicured.  Vascular: Resolved edema noted to right lateral ankle. Dorsalis Pedis and Posterior Tibial pedal pulses 2/4, Capillary Fill Time 3 seconds,(+) pedal hair growth bilateral, Temperature gradient within normal limits.  Neurology: Johney Maine sensation intact via light touch bilateral.  Musculoskeletal: There is decreased tenderness with palpation at lateral ankle and no pain to distal fibula on right ankle, no recurrent pain with palpation to plantar fascia, pain at achilles insertion on right with  tightness, Strength within normal limits in all groups bilateral except on right where there is guarding.   Xrays, right foot and ankle revealing Continued heel avulsion fracture at distal fibula and chronic heel spur. Kwire intact 1st met, No other acute findings.     Assessment and Plan: Problem List Items Addressed This Visit    None    Visit Diagnoses    Closed displaced fracture of lateral malleolus of right fibula with routine healing, subsequent encounter    -  Primary   Relevant Orders   DG Ankle Complete Right   Acute right ankle pain       Plantar fasciitis, bilateral       Tendonitis, Achilles, right         -Complete examination performed -Xrays reviewed -Discussed long term care after fracture; risks, alternatives, and benefits explained. -Continue with sugritube compression  -Continue with normal shoes  -Continue with work, regular duty, no restrictions  -Rx Night splint and encouraging icing of heel on right -Patient declined oral steroid or anti-inflammatories  -Patient also desires custom orthotics; Advised patient to return on 11-04-16 with [facility] for casting  -Patient to return to office casting for custom orthotics or sooner if condition worsens.  Landis Martins, DPM

## 2016-11-04 ENCOUNTER — Ambulatory Visit (INDEPENDENT_AMBULATORY_CARE_PROVIDER_SITE_OTHER): Payer: BLUE CROSS/BLUE SHIELD | Admitting: Sports Medicine

## 2016-11-04 DIAGNOSIS — M722 Plantar fascial fibromatosis: Secondary | ICD-10-CM

## 2016-11-04 DIAGNOSIS — M79672 Pain in left foot: Secondary | ICD-10-CM

## 2016-11-04 DIAGNOSIS — M7661 Achilles tendinitis, right leg: Secondary | ICD-10-CM

## 2016-11-04 DIAGNOSIS — M79671 Pain in right foot: Secondary | ICD-10-CM

## 2016-11-04 NOTE — Progress Notes (Signed)
[facility] today for custom functional foot orthotics. Prescription was sent to [facility]. Patient to return for pickup when called or when scheduled. -Dr. Marylene LandStover

## 2016-11-26 DIAGNOSIS — R079 Chest pain, unspecified: Secondary | ICD-10-CM

## 2016-12-31 ENCOUNTER — Ambulatory Visit: Payer: BLUE CROSS/BLUE SHIELD | Admitting: *Deleted

## 2016-12-31 DIAGNOSIS — M722 Plantar fascial fibromatosis: Secondary | ICD-10-CM

## 2016-12-31 NOTE — Progress Notes (Signed)
Patient ID: Kerri GlassingMary Heatherly, female   DOB: 27-Aug-1965, 51 y.o.   MRN: [id number]   Patient presents for orthotic pick up. Orthotics are tried on and are the wrong size.  Orthotics made for women's 7 and patient is a 9.  Will return to manufacturer for correction and call when they return.

## 2016-12-31 NOTE — Patient Instructions (Signed)

## 2017-02-25 ENCOUNTER — Ambulatory Visit (INDEPENDENT_AMBULATORY_CARE_PROVIDER_SITE_OTHER): Payer: BLUE CROSS/BLUE SHIELD | Admitting: Sports Medicine

## 2017-02-25 ENCOUNTER — Encounter (INDEPENDENT_AMBULATORY_CARE_PROVIDER_SITE_OTHER): Payer: Self-pay

## 2017-02-25 ENCOUNTER — Ambulatory Visit: Payer: BLUE CROSS/BLUE SHIELD | Admitting: Orthotics

## 2017-02-25 DIAGNOSIS — M79671 Pain in right foot: Secondary | ICD-10-CM

## 2017-02-25 DIAGNOSIS — M7661 Achilles tendinitis, right leg: Secondary | ICD-10-CM | POA: Diagnosis not present

## 2017-02-25 DIAGNOSIS — M722 Plantar fascial fibromatosis: Secondary | ICD-10-CM

## 2017-02-25 DIAGNOSIS — M779 Enthesopathy, unspecified: Secondary | ICD-10-CM

## 2017-02-25 DIAGNOSIS — M79672 Pain in left foot: Secondary | ICD-10-CM

## 2017-02-25 DIAGNOSIS — M25571 Pain in right ankle and joints of right foot: Secondary | ICD-10-CM

## 2017-02-25 DIAGNOSIS — R252 Cramp and spasm: Secondary | ICD-10-CM

## 2017-02-25 DIAGNOSIS — M7750 Other enthesopathy of unspecified foot: Secondary | ICD-10-CM | POA: Diagnosis not present

## 2017-02-25 MED ORDER — CELECOXIB 200 MG PO CAPS: 200.0000 mg | ORAL_CAPSULE | Freq: Two times a day (BID) | ORAL | 0 refills | Status: DC

## 2017-02-25 MED ORDER — CYCLOBENZAPRINE HCL 10 MG PO TABS: 10.0000 mg | ORAL_TABLET | Freq: Every day | ORAL | 0 refills | Status: DC

## 2017-02-25 NOTE — Patient Instructions (Signed)

## 2017-02-25 NOTE — Progress Notes (Signed)
Subjective: Kerri Jackson is a 51 y.o. female patient who returns to office for follow up evaluation of pain at the back of the heel on the right and sometimes across the top of the foot. Patient picked up her adjusted orthotics today. Reports that also she has noticed spider veins at right ankle. Patient denies any other pedal complaints.   Patient Active Problem List   Diagnosis Date Noted  . Wrist pain 03/13/2013    Current Outpatient Prescriptions on File Prior to Visit  Medication Sig Dispense Refill  . amoxicillin-clavulanate (AUGMENTIN) 875-125 MG tablet     . aspirin EC 81 MG tablet Take 81 mg by mouth.    . butalbital-acetaminophen-caffeine (FIORICET, ESGIC) 50-325-40 MG tablet Take by mouth.    . butalbital-acetaminophen-caffeine (FIORICET, ESGIC) 50-325-40 MG tablet Take by mouth.    . DEXILANT 60 MG capsule     . diclofenac (VOLTAREN) 75 MG EC tablet Take 1 tablet (75 mg total) by mouth 2 (two) times daily. 30 tablet 0  . docusate sodium (COLACE) 100 MG capsule Take 100 mg by mouth 2 (two) times daily.    Marland Kitchen doxycycline (VIBRAMYCIN) 100 MG capsule     . famotidine (PEPCID) 20 MG tablet Take 1 tablet (20 mg total) by mouth 2 (two) times daily. 90 tablet 2  . fluticasone (FLONASE) 50 MCG/ACT nasal spray     . HYDROcodone-acetaminophen (NORCO) 10-325 MG tablet Take 1 tablet by mouth every 6 (six) hours as needed.    Marland Kitchen HYDROmorphone (DILAUDID) 2 MG tablet Take 1 tablet (2 mg total) by mouth every 6 (six) hours as needed for moderate pain or severe pain. 30 tablet 0  . ibuprofen (ADVIL,MOTRIN) 800 MG tablet Take 1 tablet (800 mg total) by mouth every 8 (eight) hours as needed. 30 tablet 0  . Ibuprofen-Famotidine 800-26.6 MG TABS Take one tab by mouth twice daily as needed for pain and inflammation 90 tablet 2  . losartan (COZAAR) 50 MG tablet     . meloxicam (MOBIC) 15 MG tablet Take 1 tablet (15 mg total) by mouth daily. 30 tablet 0  . meloxicam (MOBIC) 7.5 MG tablet     .  methocarbamol (ROBAXIN) 500 MG tablet     . methylPREDNISolone (MEDROL DOSEPAK) 4 MG TBPK tablet Take as instructed 21 tablet 0  . metoprolol succinate (TOPROL-XL) 50 MG 24 hr tablet     . metroNIDAZOLE (METROGEL) 0.75 % vaginal gel     . oxyCODONE-acetaminophen (PERCOCET) 7.5-325 MG tablet Take 1 tablet by mouth every 8 (eight) hours as needed for severe pain. 20 tablet 0  . pantoprazole (PROTONIX) 40 MG tablet Take 40 mg by mouth.    . polyethylene glycol powder (GLYCOLAX/MIRALAX) powder     . prochlorperazine (COMPAZINE) 10 MG tablet     . PROCTOFOAM HC rectal foam     . promethazine (PHENERGAN) 25 MG tablet Take 25 mg by mouth every 6 (six) hours as needed for nausea or vomiting.    . rosuvastatin (CRESTOR) 10 MG tablet Take 10 mg by mouth.    . rosuvastatin (CRESTOR) 20 MG tablet     . verapamil (CALAN) 120 MG tablet      Current Facility-Administered Medications on File Prior to Visit  Medication Dose Route Frequency Provider Last Rate Last Dose  . triamcinolone acetonide (KENALOG) 10 MG/ML injection 10 mg  10 mg Other Once [name], DPM        No Known Allergies  Objective:  General: Alert and  oriented x3 in no acute distress  Dermatology: No open lesions bilateral lower extremities, no webspace macerations, no ecchymosis bilateral, all nails x 10 are well manicured.  Vascular: No edema noted to right lateral ankle. + spider veins, Dorsalis Pedis and Posterior Tibial pedal pulses 2/4, Capillary Fill Time 3 seconds,(+) pedal hair growth bilateral, Temperature gradient within normal limits.  Neurology: Michaell Cowing sensation intact via light touch bilateral.  Musculoskeletal: There is decreased tenderness with palpation at lateral ankle and no pain to distal fibula on right ankle, no recurrent pain with palpation to plantar fascia, pain at achilles insertion on right with tightness, and subjectively at 1st toes that is sharp with spasm. Strength within normal limits in all groups  bilateral except on right where there is guarding.    Assessment and Plan: Problem List Items Addressed This Visit    None    Visit Diagnoses    Plantar fasciitis, bilateral    -  Primary   Relevant Medications   celecoxib (CELEBREX) 200 MG capsule   Tendonitis, Achilles, right       Relevant Medications   celecoxib (CELEBREX) 200 MG capsule   Tendonitis       Relevant Medications   celecoxib (CELEBREX) 200 MG capsule   Left foot pain       Relevant Medications   celecoxib (CELEBREX) 200 MG capsule   Right foot pain       Relevant Medications   celecoxib (CELEBREX) 200 MG capsule   Spasm       Relevant Medications   cyclobenzaprine (FLEXERIL) 10 MG tablet     -Complete examination performed -Discussed plan of care -Patient to wear orthotics and dispensed tri-lock lace up braces to wear when at work to give additional support and stability  -Rx Celebrex and Flexeril to take as instructed  -Continue with good supportive tennis shoes  -Continue with night splint  -Recommend compression as needed to prevent swelling and worsening of tiny spider veins  -Continue with work, regular duty, no restrictions  -Patient to return to office 6 weeks f/u on orthotics, braces, and med check or sooner if condition worsens.  [name], DPM

## 2017-02-25 NOTE — Progress Notes (Signed)
Patient p/up CFO.Marland Kitchenadjusted.  She seemed pleased with fit and function.

## 2017-04-08 ENCOUNTER — Ambulatory Visit (INDEPENDENT_AMBULATORY_CARE_PROVIDER_SITE_OTHER): Payer: BLUE CROSS/BLUE SHIELD | Admitting: Sports Medicine

## 2017-04-08 ENCOUNTER — Encounter: Payer: Self-pay | Admitting: Sports Medicine

## 2017-04-08 DIAGNOSIS — M79671 Pain in right foot: Secondary | ICD-10-CM

## 2017-04-08 DIAGNOSIS — M779 Enthesopathy, unspecified: Secondary | ICD-10-CM | POA: Diagnosis not present

## 2017-04-08 DIAGNOSIS — M722 Plantar fascial fibromatosis: Secondary | ICD-10-CM | POA: Diagnosis not present

## 2017-04-08 DIAGNOSIS — M7661 Achilles tendinitis, right leg: Secondary | ICD-10-CM | POA: Diagnosis not present

## 2017-04-08 DIAGNOSIS — M79672 Pain in left foot: Secondary | ICD-10-CM | POA: Diagnosis not present

## 2017-04-08 MED ORDER — CELECOXIB 200 MG PO CAPS: 200.0000 mg | ORAL_CAPSULE | Freq: Two times a day (BID) | ORAL | 0 refills | Status: DC

## 2017-04-08 NOTE — Progress Notes (Signed)
Subjective: Kerri GlassingMary Jackson is a 51 y.o. female patient who returns to office for follow up evaluation and orthotic check today. Reports that her orthotics are helping sometimes at end of work has some discomfort in heel R>L but otherwise no other pains. Feels like tripping over on her feet. Patient denies any other pedal complaints.   Patient Active Problem List   Diagnosis Date Noted  . Wrist pain 03/13/2013    Current Outpatient Medications on File Prior to Visit  Medication Sig Dispense Refill  . amoxicillin-clavulanate (AUGMENTIN) 875-125 MG tablet     . aspirin EC 81 MG tablet Take 81 mg by mouth.    . butalbital-acetaminophen-caffeine (FIORICET, ESGIC) 50-325-40 MG tablet Take by mouth.    . butalbital-acetaminophen-caffeine (FIORICET, ESGIC) 50-325-40 MG tablet Take by mouth.    . cyclobenzaprine (FLEXERIL) 10 MG tablet Take 1 tablet (10 mg total) by mouth at bedtime. 30 tablet 0  . DEXILANT 60 MG capsule     . diclofenac (VOLTAREN) 75 MG EC tablet Take 1 tablet (75 mg total) by mouth 2 (two) times daily. 30 tablet 0  . docusate sodium (COLACE) 100 MG capsule Take 100 mg by mouth 2 (two) times daily.    Marland Kitchen. doxycycline (VIBRAMYCIN) 100 MG capsule     . famotidine (PEPCID) 20 MG tablet Take 1 tablet (20 mg total) by mouth 2 (two) times daily. 90 tablet 2  . fluticasone (FLONASE) 50 MCG/ACT nasal spray     . HYDROcodone-acetaminophen (NORCO) 10-325 MG tablet Take 1 tablet by mouth every 6 (six) hours as needed.    Marland Kitchen. HYDROmorphone (DILAUDID) 2 MG tablet Take 1 tablet (2 mg total) by mouth every 6 (six) hours as needed for moderate pain or severe pain. 30 tablet 0  . ibuprofen (ADVIL,MOTRIN) 800 MG tablet Take 1 tablet (800 mg total) by mouth every 8 (eight) hours as needed. 30 tablet 0  . Ibuprofen-Famotidine 800-26.6 MG TABS Take one tab by mouth twice daily as needed for pain and inflammation 90 tablet 2  . losartan (COZAAR) 50 MG tablet     . meloxicam (MOBIC) 15 MG tablet Take 1 tablet  (15 mg total) by mouth daily. 30 tablet 0  . meloxicam (MOBIC) 7.5 MG tablet     . methocarbamol (ROBAXIN) 500 MG tablet     . methylPREDNISolone (MEDROL DOSEPAK) 4 MG TBPK tablet Take as instructed 21 tablet 0  . metoprolol succinate (TOPROL-XL) 50 MG 24 hr tablet     . metroNIDAZOLE (METROGEL) 0.75 % vaginal gel     . oxyCODONE-acetaminophen (PERCOCET) 7.5-325 MG tablet Take 1 tablet by mouth every 8 (eight) hours as needed for severe pain. 20 tablet 0  . pantoprazole (PROTONIX) 40 MG tablet Take 40 mg by mouth.    . polyethylene glycol powder (GLYCOLAX/MIRALAX) powder     . prochlorperazine (COMPAZINE) 10 MG tablet     . PROCTOFOAM HC rectal foam     . promethazine (PHENERGAN) 25 MG tablet Take 25 mg by mouth every 6 (six) hours as needed for nausea or vomiting.    . rosuvastatin (CRESTOR) 10 MG tablet Take 10 mg by mouth.    . rosuvastatin (CRESTOR) 20 MG tablet     . verapamil (CALAN) 120 MG tablet      Current Facility-Administered Medications on File Prior to Visit  Medication Dose Route Frequency Provider Last Rate Last Dose  . triamcinolone acetonide (KENALOG) 10 MG/ML injection 10 mg  10 mg Other Once Asencion IslamStover, Kinte Trim, DPM  No Known Allergies  Objective:  General: Alert and oriented x3 in no acute distress  Dermatology: No open lesions bilateral lower extremities, no webspace macerations, no ecchymosis bilateral, all nails x 10 are well manicured.  Vascular: No edema noted to right lateral ankle. + spider veins, Dorsalis Pedis and Posterior Tibial pedal pulses 2/4, Capillary Fill Time 3 seconds,(+) pedal hair growth bilateral, Temperature gradient within normal limits.  Neurology: Michaell CowingGross sensation intact via light touch bilateral.  Musculoskeletal: There is subjective pain that is mild to right>left heel at EPF scar, No pain at achilles insertion on right with tightness, Strength within normal limits in all groups bilateral.    Assessment and Plan: Problem List Items  Addressed This Visit    None    Visit Diagnoses    Plantar fasciitis, bilateral       Relevant Medications   celecoxib (CELEBREX) 200 MG capsule   Tendonitis, Achilles, right       Relevant Medications   celecoxib (CELEBREX) 200 MG capsule   Tendonitis       Relevant Medications   celecoxib (CELEBREX) 200 MG capsule   Left foot pain       Relevant Medications   celecoxib (CELEBREX) 200 MG capsule   Right foot pain       Relevant Medications   celecoxib (CELEBREX) 200 MG capsule     -Complete examination performed -Discussed plan of care -Applied heel pad to boots. Patient to wear orthotics and  tri-lock lace up braces to wear when at work to give additional support and stability to feet.  -Recommend scar creams  -Refilled Celebrex  -Continue with good supportive tennis shoes; advised patient to try a different pair of work boots -Continue with night splint  -Recommend compression as needed to prevent swelling and worsening of tiny spider veins  -Continue with work, regular duty, no restrictions  -Renewed Temp Handicap -Patient to return to office as needed or sooner if condition worsens.  Asencion Islamitorya Reniah Cottingham, DPM

## 2017-10-21 ENCOUNTER — Ambulatory Visit: Payer: BLUE CROSS/BLUE SHIELD | Admitting: Sports Medicine

## 2017-10-21 ENCOUNTER — Other Ambulatory Visit: Payer: Self-pay | Admitting: Sports Medicine

## 2017-10-21 ENCOUNTER — Encounter: Payer: Self-pay | Admitting: Sports Medicine

## 2017-10-21 ENCOUNTER — Ambulatory Visit (INDEPENDENT_AMBULATORY_CARE_PROVIDER_SITE_OTHER): Payer: BLUE CROSS/BLUE SHIELD

## 2017-10-21 DIAGNOSIS — M7661 Achilles tendinitis, right leg: Secondary | ICD-10-CM

## 2017-10-21 DIAGNOSIS — M722 Plantar fascial fibromatosis: Secondary | ICD-10-CM | POA: Diagnosis not present

## 2017-10-21 DIAGNOSIS — M79672 Pain in left foot: Secondary | ICD-10-CM | POA: Diagnosis not present

## 2017-10-21 DIAGNOSIS — M79671 Pain in right foot: Secondary | ICD-10-CM

## 2017-10-21 DIAGNOSIS — G8929 Other chronic pain: Secondary | ICD-10-CM | POA: Diagnosis not present

## 2017-10-21 DIAGNOSIS — M7751 Other enthesopathy of right foot: Secondary | ICD-10-CM

## 2017-10-21 DIAGNOSIS — M779 Enthesopathy, unspecified: Secondary | ICD-10-CM

## 2017-10-21 DIAGNOSIS — M19079 Primary osteoarthritis, unspecified ankle and foot: Secondary | ICD-10-CM

## 2017-10-21 DIAGNOSIS — M25571 Pain in right ankle and joints of right foot: Secondary | ICD-10-CM

## 2017-10-21 DIAGNOSIS — M79673 Pain in unspecified foot: Secondary | ICD-10-CM

## 2017-10-21 DIAGNOSIS — M25572 Pain in left ankle and joints of left foot: Secondary | ICD-10-CM

## 2017-10-21 NOTE — Patient Instructions (Signed)
Supplements that are helpful for arthritic conditions: Glucosamine and Chondroitin Sulfate MSM Turmeric Omega 3 Ginger Please further consult with your primary care doctor or rheumatologist about other further recommendations to help manage arthritis or painful joints

## 2017-10-21 NOTE — Progress Notes (Signed)
Subjective: Kerri Jackson is a 52 y.o. female patient who returns to office for follow up evaluation bilateral foot and ankle pain.  Patient states that her job has changed restrictions where she must were still toe boots and must work 12 hours.  Reports that she has constant burning stinging dull achy pain to both feet and ankles with the right side being most symptomatic states that she has tried everything she is currently taking ibuprofen for migraines as well which does not offer much relief and also has tried gentle stretching icing braces and good supportive shoes.  Patient strongly feels like her steel toe boots are also adding to this problem with her feet and her long hours and is requesting information on disability.  Patient denies any other acute problems at this time.  Patient Active Problem List   Diagnosis Date Noted  . Wrist pain 03/13/2013    Current Outpatient Medications on File Prior to Visit  Medication Sig Dispense Refill  . amoxicillin-clavulanate (AUGMENTIN) 875-125 MG tablet     . aspirin EC 81 MG tablet Take 81 mg by mouth.    . butalbital-acetaminophen-caffeine (FIORICET, ESGIC) 50-325-40 MG tablet Take by mouth.    . butalbital-acetaminophen-caffeine (FIORICET, ESGIC) 50-325-40 MG tablet Take by mouth.    . celecoxib (CELEBREX) 200 MG capsule Take 1 capsule (200 mg total) 2 (two) times daily by mouth. 180 capsule 0  . cyclobenzaprine (FLEXERIL) 10 MG tablet Take 1 tablet (10 mg total) by mouth at bedtime. 30 tablet 0  . DEXILANT 60 MG capsule     . diclofenac (VOLTAREN) 75 MG EC tablet Take 1 tablet (75 mg total) by mouth 2 (two) times daily. 30 tablet 0  . docusate sodium (COLACE) 100 MG capsule Take 100 mg by mouth 2 (two) times daily.    Marland Kitchen doxycycline (VIBRAMYCIN) 100 MG capsule     . famotidine (PEPCID) 20 MG tablet Take 1 tablet (20 mg total) by mouth 2 (two) times daily. 90 tablet 2  . fluticasone (FLONASE) 50 MCG/ACT nasal spray     . HYDROcodone-acetaminophen  (NORCO) 10-325 MG tablet Take 1 tablet by mouth every 6 (six) hours as needed.    Marland Kitchen HYDROmorphone (DILAUDID) 2 MG tablet Take 1 tablet (2 mg total) by mouth every 6 (six) hours as needed for moderate pain or severe pain. 30 tablet 0  . ibuprofen (ADVIL,MOTRIN) 800 MG tablet Take 1 tablet (800 mg total) by mouth every 8 (eight) hours as needed. 30 tablet 0  . Ibuprofen-Famotidine 800-26.6 MG TABS Take one tab by mouth twice daily as needed for pain and inflammation 90 tablet 2  . losartan (COZAAR) 50 MG tablet     . meloxicam (MOBIC) 15 MG tablet Take 1 tablet (15 mg total) by mouth daily. 30 tablet 0  . meloxicam (MOBIC) 7.5 MG tablet     . methocarbamol (ROBAXIN) 500 MG tablet     . methylPREDNISolone (MEDROL DOSEPAK) 4 MG TBPK tablet Take as instructed 21 tablet 0  . metoprolol succinate (TOPROL-XL) 50 MG 24 hr tablet     . metroNIDAZOLE (METROGEL) 0.75 % vaginal gel     . oxyCODONE-acetaminophen (PERCOCET) 7.5-325 MG tablet Take 1 tablet by mouth every 8 (eight) hours as needed for severe pain. 20 tablet 0  . pantoprazole (PROTONIX) 40 MG tablet Take 40 mg by mouth.    . polyethylene glycol powder (GLYCOLAX/MIRALAX) powder     . prochlorperazine (COMPAZINE) 10 MG tablet     . PROCTOFOAM  HC rectal foam     . promethazine (PHENERGAN) 25 MG tablet Take 25 mg by mouth every 6 (six) hours as needed for nausea or vomiting.    . rosuvastatin (CRESTOR) 10 MG tablet Take 10 mg by mouth.    . rosuvastatin (CRESTOR) 20 MG tablet     . verapamil (CALAN) 120 MG tablet      Current Facility-Administered Medications on File Prior to Visit  Medication Dose Route Frequency Provider Last Rate Last Dose  . triamcinolone acetonide (KENALOG) 10 MG/ML injection 10 mg  10 mg Other Once [name], DPM        No Known Allergies  Objective:  General: Alert and oriented x3 in no acute distress  Dermatology: No open lesions bilateral lower extremities, no webspace macerations, no ecchymosis bilateral,  all nails x 10 are well manicured.  Vascular: No edema noted to right lateral ankle. + spider veins, Dorsalis Pedis and Posterior Tibial pedal pulses 2/4, Capillary Fill Time 3 seconds,(+) pedal hair growth bilateral, Temperature gradient within normal limits.  Neurology: Michaell Cowing sensation intact via light touch bilateral.  Musculoskeletal: There is pain to palpation to the sinus tarsi and lateral ankle right greater than left there is diffuse foot pain plantar surfaces bilateral with tightness noted at the Achilles insertions bilateral right greater than left there is mild guarding with range of motion however strength is within normal limits.  T .    Assessment and Plan: Problem List Items Addressed This Visit    None    Visit Diagnoses    Acute bilateral ankle pain    -  Primary   Relevant Orders   DG Ankle Complete Right   DG Ankle Complete Left   Capsulitis of right ankle       Arthritis of foot       Tendonitis, Achilles, right       Plantar fasciitis, bilateral       Left foot pain       Right foot pain       Chronic foot pain, unspecified laterality         -Complete examination performed -Discussed plan of care for acute on chronic foot pain and arthritis with previous history of trauma and fracture on her right ankle -Advised patient that likely she will probably always have foot pain especially with her current work conditions of excessive standing and walking I advised patient that if she is considering disability that she has report to a medical doctor who does disability exams and that I cannot declare disability for her feet however I can support by sending any clinical documentation from previous encounters to that reviewing physician -After oral consent and aseptic prep, injected a mixture containing 1 ml of 2%  plain lidocaine, 1 ml 0.5% plain marcaine, 0.5 ml of kenalog 10 and 0.5 ml of dexamethasone phosphate into right ankle at sinus tarsi without complication.  Post-injection care discussed with patient.  Patient could not tolerate have an injection today done on the left. -Advised patient to trial over-the-counter supplements for arthritis that she has and to continue with Motrin and good supportive shoes bracing rest ice elevation.  Ice packs applied at today's visit -Patient to return to office as needed or sooner if condition worsens.  [name], DPM

## 2018-03-08 ENCOUNTER — Other Ambulatory Visit: Payer: Self-pay | Admitting: Sports Medicine

## 2018-03-08 DIAGNOSIS — M7661 Achilles tendinitis, right leg: Secondary | ICD-10-CM

## 2018-03-08 DIAGNOSIS — M722 Plantar fascial fibromatosis: Secondary | ICD-10-CM

## 2018-03-08 DIAGNOSIS — M779 Enthesopathy, unspecified: Secondary | ICD-10-CM

## 2018-03-08 DIAGNOSIS — M79671 Pain in right foot: Secondary | ICD-10-CM

## 2018-03-08 DIAGNOSIS — M79672 Pain in left foot: Secondary | ICD-10-CM

## 2020-12-18 ENCOUNTER — Other Ambulatory Visit: Payer: Self-pay

## 2020-12-18 DIAGNOSIS — G43909 Migraine, unspecified, not intractable, without status migrainosus: Secondary | ICD-10-CM | POA: Insufficient documentation

## 2020-12-18 DIAGNOSIS — E559 Vitamin D deficiency, unspecified: Secondary | ICD-10-CM | POA: Insufficient documentation

## 2020-12-18 DIAGNOSIS — Z8249 Family history of ischemic heart disease and other diseases of the circulatory system: Secondary | ICD-10-CM | POA: Insufficient documentation

## 2020-12-18 DIAGNOSIS — B001 Herpesviral vesicular dermatitis: Secondary | ICD-10-CM | POA: Insufficient documentation

## 2020-12-18 DIAGNOSIS — E7800 Pure hypercholesterolemia, unspecified: Secondary | ICD-10-CM | POA: Insufficient documentation

## 2020-12-18 DIAGNOSIS — I10 Essential (primary) hypertension: Secondary | ICD-10-CM | POA: Insufficient documentation

## 2020-12-18 DIAGNOSIS — K219 Gastro-esophageal reflux disease without esophagitis: Secondary | ICD-10-CM | POA: Insufficient documentation

## 2020-12-18 DIAGNOSIS — K760 Fatty (change of) liver, not elsewhere classified: Secondary | ICD-10-CM | POA: Insufficient documentation

## 2020-12-18 DIAGNOSIS — R079 Chest pain, unspecified: Secondary | ICD-10-CM | POA: Insufficient documentation

## 2021-01-01 ENCOUNTER — Encounter: Payer: Self-pay | Admitting: Cardiology

## 2021-01-01 ENCOUNTER — Ambulatory Visit: Payer: BC Managed Care – PPO | Admitting: Cardiology

## 2021-01-01 ENCOUNTER — Other Ambulatory Visit: Payer: Self-pay

## 2021-01-01 VITALS — BP 146/86 | HR 72 | Ht 63.0 in | Wt 155.4 lb

## 2021-01-01 DIAGNOSIS — Z79899 Other long term (current) drug therapy: Secondary | ICD-10-CM

## 2021-01-01 DIAGNOSIS — R072 Precordial pain: Secondary | ICD-10-CM

## 2021-01-01 DIAGNOSIS — R079 Chest pain, unspecified: Secondary | ICD-10-CM

## 2021-01-01 DIAGNOSIS — I10 Essential (primary) hypertension: Secondary | ICD-10-CM

## 2021-01-01 DIAGNOSIS — K219 Gastro-esophageal reflux disease without esophagitis: Secondary | ICD-10-CM

## 2021-01-01 DIAGNOSIS — F419 Anxiety disorder, unspecified: Secondary | ICD-10-CM | POA: Insufficient documentation

## 2021-01-01 DIAGNOSIS — R1013 Epigastric pain: Secondary | ICD-10-CM

## 2021-01-01 DIAGNOSIS — G43919 Migraine, unspecified, intractable, without status migrainosus: Secondary | ICD-10-CM | POA: Insufficient documentation

## 2021-01-01 DIAGNOSIS — E782 Mixed hyperlipidemia: Secondary | ICD-10-CM

## 2021-01-01 DIAGNOSIS — R002 Palpitations: Secondary | ICD-10-CM

## 2021-01-01 DIAGNOSIS — I499 Cardiac arrhythmia, unspecified: Secondary | ICD-10-CM

## 2021-01-01 DIAGNOSIS — M1991 Primary osteoarthritis, unspecified site: Secondary | ICD-10-CM

## 2021-01-01 HISTORY — DX: Cardiac arrhythmia, unspecified: I49.9

## 2021-01-01 HISTORY — DX: Essential (primary) hypertension: I10

## 2021-01-01 HISTORY — DX: Anxiety disorder, unspecified: F41.9

## 2021-01-01 HISTORY — DX: Chest pain, unspecified: R07.9

## 2021-01-01 HISTORY — DX: Gastro-esophageal reflux disease without esophagitis: K21.9

## 2021-01-01 HISTORY — DX: Primary osteoarthritis, unspecified site: M19.91

## 2021-01-01 HISTORY — DX: Mixed hyperlipidemia: E78.2

## 2021-01-01 HISTORY — DX: Other long term (current) drug therapy: Z79.899

## 2021-01-01 HISTORY — DX: Migraine, unspecified, intractable, without status migrainosus: G43.919

## 2021-01-01 HISTORY — DX: Epigastric pain: R10.13

## 2021-01-01 HISTORY — DX: Palpitations: R00.2

## 2021-01-01 MED ORDER — NITROGLYCERIN 0.4 MG SL SUBL: 0.4000 mg | SUBLINGUAL_TABLET | SUBLINGUAL | 6 refills | Status: DC | PRN

## 2021-01-01 NOTE — Patient Instructions (Signed)
Medication Instructions:  Your physician has recommended you make the following change in your medication:   Use nitroglycerin 1 tablet placed under the tongue at the first sign of chest pain or an angina attack. 1 tablet may be used every 5 minutes as needed, for up to 15 minutes. Do not take more than 3 tablets in 15 minutes. If pain persist call 911 or go to the nearest ED.   *If you need a refill on your cardiac medications before your next appointment, please call your pharmacy*   Lab Work: None ordered If you have labs (blood work) drawn today and your tests are completely normal, you will receive your results only by: MyChart Message (if you have MyChart) OR A paper copy in the mail If you have any lab test that is abnormal or we need to change your treatment, we will call you to review the results.   Testing/Procedures: Your physician has requested that you have a lexiscan myoview. For further information please visit [email or web address] Please follow instruction sheet, as given.  The test will take approximately 3 to 4 hours to complete; you may bring reading material.  If someone comes with you to your appointment, they will need to remain in the main lobby due to limited space in the testing area. **If you are pregnant or breastfeeding, please notify the nuclear lab prior to your appointment**  How to prepare for your Myocardial Perfusion Test: Do not eat or drink 3 hours prior to your test, except you may have water. Do not consume products containing caffeine (regular or decaffeinated) 12 hours prior to your test. (ex: coffee, chocolate, sodas, tea). Do bring a list of your current medications with you.  If not listed below, you may take your medications as normal. Do not take metoprolol (Lopressor, Toprol) for 24 hours prior to the test.  Bring the medication to your appointment as you may be required to take it once the test is complete. Do wear comfortable clothes (no  dresses or overalls) and walking shoes, tennis shoes preferred (No heels or open toe shoes are allowed). Do NOT wear cologne, perfume, aftershave, or lotions (deodorant is allowed). If these instructions are not followed, your test will have to be rescheduled.    Follow-Up: At [facility], you and your health needs are our priority.  As part of our continuing mission to provide you with exceptional heart care, we have created designated Provider Care Teams.  These Care Teams include your primary Cardiologist (physician) and Advanced Practice Providers (APPs -  Physician Assistants and Nurse Practitioners) who all work together to provide you with the care you need, when you need it.  We recommend signing up for the patient portal called "MyChart".  Sign up information is provided on this After Visit Summary.  MyChart is used to connect with patients for Virtual Visits (Telemedicine).  Patients are able to view lab/test results, encounter notes, upcoming appointments, etc.  Non-urgent messages can be sent to your provider as well.   To learn more about what you can do with MyChart, go to [email or web address].    Your next appointment:   3 month(s)  The format for your next appointment:   In Person  Provider:   Belva Crome, MD   Other Instructions Cardiac Nuclear Scan A cardiac nuclear scan is a test that is done to check the flow of blood to your heart. It is done when you are resting and when you are exercising. The test looks  for problems such as: Not enough blood reaching a portion of the heart. The heart muscle not working as it should. You may need this test if: You have heart disease. You have had lab results that are not normal. You have had heart surgery or a balloon procedure to open up blocked arteries (angioplasty). You have chest pain. You have shortness of breath. In this test, a special dye (tracer) is put into your bloodstream. The tracer will travel to your  heart. A camera will then take pictures of your heart to see how the tracer moves through your heart. This test is usually done at a hospital and takes 2-4 hours. Tell a doctor about: Any allergies you have. All medicines you are taking, including vitamins, herbs, eye drops, creams, and over-the-counter medicines. Any problems you or family members have had with anesthetic medicines. Any blood disorders you have. Any surgeries you have had. Any medical conditions you have. Whether you are pregnant or may be pregnant. What are the risks? Generally, this is a safe test. However, problems may occur, such as: Serious chest pain and heart attack. This is only a risk if the stress portion of the test is done. Rapid heartbeat. A feeling of warmth in your chest. This feeling usually does not last long. Allergic reaction to the tracer. What happens before the test? Ask your doctor about changing or stopping your normal medicines. This is important. Follow instructions from your doctor about what you cannot eat or drink. Remove your jewelry on the day of the test. What happens during the test? An IV tube will be inserted into one of your veins. Your doctor will give you a small amount of tracer through the IV tube. You will wait for 20-40 minutes while the tracer moves through your bloodstream. Your heart will be monitored with an electrocardiogram (ECG). You will lie down on an exam table. Pictures of your heart will be taken for about 15-20 minutes. You may also have a stress test. For this test, one of these things may be done: You will be asked to exercise on a treadmill or a stationary bike. You will be given medicines that will make your heart work harder. This is done if you are unable to exercise. When blood flow to your heart has peaked, a tracer will again be given through the IV tube. After 20-40 minutes, you will get back on the exam table. More pictures will be taken of your  heart. Depending on the tracer that is used, more pictures may need to be taken 3-4 hours later. Your IV tube will be removed when the test is over. The test may vary among doctors and hospitals. What happens after the test? Ask your doctor: Whether you can return to your normal schedule, including diet, activities, and medicines. Whether you should drink more fluids. This will help to remove the tracer from your body. Drink enough fluid to keep your pee (urine) pale yellow. Ask your doctor, or the department that is doing the test: When will my results be ready? How will I get my results? Summary A cardiac nuclear scan is a test that is done to check the flow of blood to your heart. Tell your doctor whether you are pregnant or may be pregnant. Before the test, ask your doctor about changing or stopping your normal medicines. This is important. Ask your doctor whether you can return to your normal activities. You may be asked to drink more fluids. This information  is not intended to replace advice given to you by your health care provider. Make sure you discuss any questions you have with your health care provider. Document Revised: 08/31/2018 Document Reviewed: 10/25/2017 Elsevier Patient Education  2021 [employer].

## 2021-01-01 NOTE — Progress Notes (Signed)
Cardiology Office Note:    Date:  01/01/2021   ID:  Kerri Jackson, DOB 13-Feb-1966, MRN [id number]  PCP:  Patient, No Pcp Per (Inactive)  Cardiologist:  Garwin Brothers, MD   Referring MD: Rolm Gala, NP    ASSESSMENT:    1. Essential hypertension   2. Mixed hyperlipidemia   3. Chest pain of uncertain etiology    PLAN:    In order of problems listed above:  Primary prevention stressed with the patient.  Importance of compliance with diet medication stressed and she vocalized understanding. Chest pain: Atypical etiology however in view of risk factors we will do an exercise stress Cardiolite.  She is agreeable.  Sublingual nitroglycerin prescription was sent, its protocol and 911 protocol explained and the patient vocalized understanding questions were answered to the patient's satisfaction Essential hypertension: Blood pressure stable and diet was emphasized. Mixed dyslipidemia: Diet was emphasized.  Lipids followed by primary care doctor.  She knows to go to the nearest emergency room for any concerning symptoms. Patient will be seen in follow-up appointment in 6 months or earlier if the patient has any concerns    Medication Adjustments/Labs and Tests Ordered: Current medicines are reviewed at length with the patient today.  Concerns regarding medicines are outlined above.  No orders of the defined types were placed in this encounter.  No orders of the defined types were placed in this encounter.    History of Present Illness:    Kerri Jackson is a 55 y.o. female who is being seen today for the evaluation of chest pain at the request of Rolm Gala, NP.  Patient is a pleasant 55 year old female.  She has past medical history of essential hypertension and mixed dyslipidemia.  She mentions to me that she occasionally has chest pain.  No radiation to the neck or to the arms.  It does not occur on exertion.  Patient mentions to me that it may occur when she is stressed out.  She  says that she is sexually active and sexual activity does not bring around the symptoms.  At the time of my evaluation, the patient is alert awake oriented and in no distress.  Past Medical History:  Diagnosis Date   Chest pain    Family history of coronary artery disease    Fever blister    GERD (gastroesophageal reflux disease)    Hepatic steatosis    High cholesterol    Hypertension    Migraines    Vitamin D deficiency    Wrist pain 03/13/2013    Past Surgical History:  Procedure Laterality Date   CESAREAN SECTION     FINGER SURGERY     left hand    Current Medications: Current Meds  Medication Sig   metoprolol succinate (TOPROL-XL) 50 MG 24 hr tablet Take 50 mg by mouth daily.   pantoprazole (PROTONIX) 40 MG tablet Take 40 mg by mouth.   rosuvastatin (CRESTOR) 20 MG tablet Take 20 mg by mouth daily.   verapamil (CALAN) 120 MG tablet Take 120 mg by mouth daily.     Allergies:   Tramadol hcl   Social History   Socioeconomic History   Marital status: Divorced    Spouse name: Not on file   Number of children: Not on file   Years of education: Not on file   Highest education level: Not on file  Occupational History   Not on file  Tobacco Use   Smoking status: Never   Smokeless tobacco:  Never  Substance and Sexual Activity   Alcohol use: No   Drug use: No   Sexual activity: Not on file  Other Topics Concern   Not on file  Social History Narrative   Not on file   Social Determinants of Health   Financial Resource Strain: Not on file  Food Insecurity: Not on file  Transportation Needs: Not on file  Physical Activity: Not on file  Stress: Not on file  Social Connections: Not on file     Family History: The patient's family history includes Heart attack in her brother, father, and mother; Stroke in her brother.  ROS:   Please see the history of present illness.    All other systems reviewed and are negative.  EKGs/Labs/Other Studies Reviewed:     The following studies were reviewed today: EKG was sinus rhythm septal myocardial infarction of undetermined age.  Poor anterior forces probably contributing to this finding.   Recent Labs: No results found for requested labs within last 8760 hours.  Recent Lipid Panel No results found for: CHOL, TRIG, HDL, CHOLHDL, VLDL, LDLCALC, LDLDIRECT  Physical Exam:    VS:  BP (!) 146/86   Pulse 72   Ht 5\' 3"  (1.6 m)   Wt 155 lb 6.4 oz (70.5 kg)   SpO2 97%   BMI 27.53 kg/m     Wt Readings from Last 3 Encounters:  01/01/21 155 lb 6.4 oz (70.5 kg)     GEN: Patient is in no acute distress HEENT: Normal NECK: No JVD; No carotid bruits LYMPHATICS: No lymphadenopathy CARDIAC: S1 S2 regular, 2/6 systolic murmur at the apex. RESPIRATORY:  Clear to auscultation without rales, wheezing or rhonchi  ABDOMEN: Soft, non-tender, non-distended MUSCULOSKELETAL:  No edema; No deformity  SKIN: Warm and dry NEUROLOGIC:  Alert and oriented x 3 PSYCHIATRIC:  Normal affect    Signed, 03/03/21, MD  01/01/2021 11:26 AM     [facility]

## 2021-01-08 ENCOUNTER — Telehealth: Payer: Self-pay | Admitting: *Deleted

## 2021-01-08 NOTE — Telephone Encounter (Signed)
Patient given detailed instructions per Myocardial Perfusion Study Information Sheet for the test on 01/15/21 at 1100. Patient notified to arrive 15 minutes early and that it is imperative to arrive on time for appointment to keep from having the test rescheduled.  If you need to cancel or reschedule your appointment, please call the office within 24 hours of your appointment. . Patient verbalized understanding.Miyako Oelke, Adelene Idler No mychart

## 2021-01-15 ENCOUNTER — Ambulatory Visit (INDEPENDENT_AMBULATORY_CARE_PROVIDER_SITE_OTHER): Payer: BC Managed Care – PPO

## 2021-01-15 ENCOUNTER — Other Ambulatory Visit: Payer: Self-pay

## 2021-01-15 DIAGNOSIS — R072 Precordial pain: Secondary | ICD-10-CM | POA: Diagnosis not present

## 2021-01-15 DIAGNOSIS — R079 Chest pain, unspecified: Secondary | ICD-10-CM

## 2021-01-15 MED ORDER — TECHNETIUM TC 99M TETROFOSMIN IV KIT
9.9000 | PACK | Freq: Once | INTRAVENOUS | Status: AC | PRN
  Administered 2021-01-15: 9.9 via INTRAVENOUS

## 2021-01-15 MED ORDER — TECHNETIUM TC 99M TETROFOSMIN IV KIT
29.4000 | PACK | Freq: Once | INTRAVENOUS | Status: AC | PRN
  Administered 2021-01-15: 29.4 via INTRAVENOUS

## 2021-01-16 LAB — MYOCARDIAL PERFUSION IMAGING
Angina Index: 0
Base ST Depression (mm): 0 mm
Estimated workload: 8
Exercise duration (min): 7 min
Exercise duration (sec): 23 s
LV dias vol: 54 mL (ref 46–106)
LV sys vol: 12 mL
MPHR: 166 {beats}/min
Nuc Stress EF: 78 %
Peak HR: 162 {beats}/min
Percent HR: 97 %
Rest HR: 71 {beats}/min
Rest Nuclear Isotope Dose: 9.9 mCi
SDS: 4
SRS: 2
SSS: 6
Stress Nuclear Isotope Dose: 29.4 mCi
TID: 1.06

## 2021-04-03 ENCOUNTER — Other Ambulatory Visit: Payer: Self-pay

## 2021-04-04 ENCOUNTER — Ambulatory Visit: Payer: BC Managed Care – PPO | Admitting: Cardiology

## 2021-04-14 ENCOUNTER — Encounter: Payer: Self-pay | Admitting: Neurology

## 2021-05-22 NOTE — Progress Notes (Signed)
NEUROLOGY CONSULTATION NOTE  Kerri Jackson MRN: [id number] DOB: 16-Oct-1965  Referring provider: Rolm Gala, NP Primary care provider: Rolm Gala, NP  Reason for consult:  headache  Assessment/Plan:   Migraine without aura, without status migrainosus, not intractable - improved on Nurtec  Migraine prevention:  Continue Nurtec 75mg  every other day Migraine rescue:  I will have her try Reyvow.  At this time, I would like to avoid triptans due to history of cardiac arrhythmia and atypical chest pain with CAD risk factors. Zofran for nausea. Limit use of pain relievers to no more than 2 days out of week to prevent risk of rebound or medication-overuse headache. Keep headache diary Follow up 7 months.    Subjective:  Kerri Jackson is a 55 year old female with HTN, HLD, GERD, cardiac arrhythmia, anxiety and migraines who presents for headaches.  History supplemented by referring provider's note.  Onset:  55 years old but resolved.  Returned in her 30s.  Worse in 2022 Location:  orbital, bi-temporal Quality:  eyes burn, pounding Intensity:  8-9/10.   Aura:  absent Prodrome:  absent Associated symptoms:  Nausea, photophobia, phonophobia, blurred vision.  She denies associated unilateral numbness or weakness. Duration:  off and on for week - since starting Nurtec, 2 days Frequency:  once a week - Started Nurtec and now gets about 2 a month Frequency of abortive medication: acetaminophen or ibuprofen several times a week due to generalized pain. Triggers:  stress, certain smells (dead leaves), jaw problems (wears dentures) Relieving factors:  rest in dark and quiet room Activity:  aggravates  She has had an eye exam this past year. CT head on 11/15/2015 was negative.  Rescue protocol:  acetaminophen 500mg  powder, ibuprofen 800mg  second line Current NSAIDS/analgesics:  acetaminophen 500mg  powder, ibuprofen 800mg  Current triptans:  none Current ergotamine:  none Current anti-emetic:   ondansetron 4mg  Current muscle relaxants:  none Current Antihypertensive medications:  metoprolol succinate, verapamil 120mg  daily Current Antidepressant medications:  none Current Anticonvulsant medications:  none Current anti-CGRP:  Nurtec QOD Current Vitamins/Herbal/Supplements:  none Current Antihistamines/Decongestants:  none Other therapy:  none Hormone/birth control:  none   Past NSAIDS/analgesics:  ibuprofen 800mg , acetaminophen, Fioricet, celecoxib, diclofenac, meloxicam Past abortive triptans:  none Past abortive ergotamine:  none Past muscle relaxants:  cyclobenzaprine, methocarbamol Past anti-emetic:  promethazine Past antihypertensive medications:  none Past antidepressant medications:  none Past anticonvulsant medications:  topiramate Past anti-CGRP:  Ubrelvy 100mg , Ajovy (injection site reaction) Past vitamins/Herbal/Supplements:  none Past antihistamines/decongestants:  Flonase Other past therapies:  none  Caffeine:  No coffee.  Tea daily Diet:  does not drink much water.  Eats 2 meals a day Exercise:  walks daily Depression:  denies; Anxiety:  may get aggravated easily Other pain:  jaw pain, pain in feet (arthritis) Sleep hygiene:  varies Family history of headache:  mom (migraines) Works utilities - cleaning and walking      PAST MEDICAL HISTORY: Past Medical History:  Diagnosis Date   Anxiety disorder 01/01/2021   Cardiac arrhythmia 01/01/2021   Chest pain    Chest pain of uncertain etiology 01/01/2021   Epigastric pain 01/01/2021   Essential hypertension 01/01/2021   Family history of coronary artery disease    Fever blister    Gastro-esophageal reflux disease without esophagitis 01/01/2021   GERD (gastroesophageal reflux disease)    Hepatic steatosis    High cholesterol    Hypertension    Migraines    Mixed hyperlipidemia 01/01/2021   Other long term (current)  drug therapy 01/01/2021   Palpitations 01/01/2021   Primary osteoarthritis 01/01/2021    Refractory migraine with aura 01/01/2021   Vitamin D deficiency    Wrist pain 03/13/2013    PAST SURGICAL HISTORY: Past Surgical History:  Procedure Laterality Date   CESAREAN SECTION     FINGER SURGERY     left hand    MEDICATIONS: Current Outpatient Medications on File Prior to Visit  Medication Sig Dispense Refill   metoprolol succinate (TOPROL-XL) 50 MG 24 hr tablet Take 50 mg by mouth daily.     nitroGLYCERIN (NITROSTAT) 0.4 MG SL tablet Place 0.4 mg under the tongue every 5 (five) minutes as needed for chest pain.     NURTEC 75 MG TBDP Take 1 tablet by mouth every other day.     pantoprazole (PROTONIX) 40 MG tablet Take 40 mg by mouth.     rosuvastatin (CRESTOR) 20 MG tablet Take 20 mg by mouth daily.     verapamil (CALAN) 120 MG tablet Take 120 mg by mouth daily.     No current facility-administered medications on file prior to visit.    ALLERGIES: Allergies  Allergen Reactions   Tramadol Hcl     Other reaction(s): hives    FAMILY HISTORY: Family History  Problem Relation Age of Onset   Heart attack Mother    Heart attack Father    Stroke Brother    Heart attack Brother     Objective:  Blood pressure (!) 150/79, pulse 98, resp. rate 18, height 5\' 3"  (1.6 m), weight 161 lb (73 kg), SpO2 99 %. General: No acute distress.  Patient appears well-groomed.   Head:  Normocephalic/atraumatic Eyes:  fundi examined but not visualized Neck: supple, no paraspinal tenderness, full range of motion Back: No paraspinal tenderness Heart: regular rate and rhythm Lungs: Clear to auscultation bilaterally. Vascular: No carotid bruits. Neurological Exam: Mental status: alert and oriented to person, place, and time, recent and remote memory intact, fund of knowledge intact, attention and concentration intact, speech fluent and not dysarthric, language intact. Cranial nerves: CN I: not tested CN II: pupils equal, round and reactive to light, visual fields intact CN III, IV, VI:   full range of motion, no nystagmus, no ptosis CN V: facial sensation intact. CN VII: upper and lower face symmetric CN VIII: hearing intact CN IX, X: gag intact, uvula midline CN XI: sternocleidomastoid and trapezius muscles intact CN XII: tongue midline Bulk & Tone: normal, no fasciculations. Motor:  muscle strength 5/5 throughout Sensation:  Pinprick, temperature and vibratory sensation intact. Deep Tendon Reflexes:  2+ throughout,  toes downgoing.   Finger to nose testing:  Without dysmetria.   Heel to shin:  Without dysmetria.   Gait:  Normal station and stride.  Romberg negative.    Thank you for allowing me to take part in the care of this patient.  , DO

## 2021-05-23 ENCOUNTER — Ambulatory Visit: Payer: BC Managed Care – PPO | Admitting: Neurology

## 2021-05-23 ENCOUNTER — Other Ambulatory Visit: Payer: Self-pay

## 2021-05-23 ENCOUNTER — Encounter: Payer: Self-pay | Admitting: Neurology

## 2021-05-23 VITALS — BP 150/79 | HR 98 | Resp 18 | Ht 63.0 in | Wt 161.0 lb

## 2021-05-23 DIAGNOSIS — G43009 Migraine without aura, not intractable, without status migrainosus: Secondary | ICD-10-CM | POA: Diagnosis not present

## 2021-05-23 NOTE — Patient Instructions (Signed)
°  Continue Nurtec 75mg  every other day Take Reyvow 100mg  at earliest onset of headache.  No more than 1 in 24 hours.  Let me know if effective Limit use of pain relievers to no more than 2 days out of the week.  These medications include acetaminophen, NSAIDs (ibuprofen/Advil/Motrin, naproxen/Aleve, triptans (Imitrex/sumatriptan), Excedrin, and narcotics.  This will help reduce risk of rebound headaches. Be aware of common food triggers:  - Caffeine:  coffee, black tea, cola, Mt. Dew  - Chocolate  - Dairy:  aged cheeses (brie, blue, cheddar, gouda, [place], provolone, [place], Swiss, etc), chocolate milk, buttermilk, sour cream, limit eggs and yogurt  - Nuts, peanut butter  - Alcohol  - Cereals/grains:  FRESH breads (fresh bagels, sourdough, doughnuts), yeast productions  - Processed/canned/aged/cured meats (pre-packaged deli meats, hotdogs)  - MSG/glutamate:  soy sauce, flavor enhancer, pickled/preserved/marinated foods  - Sweeteners:  aspartame (Equal, Nutrasweet).  Sugar and Splenda are okay  - Vegetables:  legumes (lima beans, lentils, snow peas, fava beans, pinto peans, peas, garbanzo beans), sauerkraut, onions, olives, pickles  - Fruit:  avocados, bananas, citrus fruit (orange, lemon, grapefruit), mango  - Other:  Frozen meals, macaroni and cheese Routine exercise Stay adequately hydrated (aim for 64 oz water daily) Keep headache diary Maintain proper stress management Maintain proper sleep hygiene Do not skip meals Consider supplements:  magnesium citrate 400mg  daily, riboflavin 400mg  daily, coenzyme Q10 100mg  three times daily.

## 2021-12-10 ENCOUNTER — Ambulatory Visit: Payer: BC Managed Care – PPO | Admitting: Podiatrist

## 2021-12-12 ENCOUNTER — Ambulatory Visit: Payer: BC Managed Care – PPO | Admitting: Podiatrist

## 2021-12-16 ENCOUNTER — Other Ambulatory Visit: Payer: Self-pay | Admitting: *Deleted

## 2021-12-16 DIAGNOSIS — M25569 Pain in unspecified knee: Secondary | ICD-10-CM

## 2021-12-21 NOTE — Progress Notes (Unsigned)
NEUROLOGY FOLLOW UP OFFICE NOTE  Kerri Jackson [id number]  Assessment/Plan:   Migraine without aura, without status migrainosus, not intractable - improved on Nurtec   Migraine prevention:  Continue Nurtec 75mg  every other day Migraine rescue:  I will have her try Reyvow.  At this time, I would like to avoid triptans due to history of cardiac arrhythmia and atypical chest pain with CAD risk factors. Zofran for nausea. Limit use of pain relievers to no more than 2 days out of week to prevent risk of rebound or medication-overuse headache. Keep headache diary Follow up 7 months.       Subjective:  Kerri Jackson is a 56 year old female with HTN, HLD, GERD, cardiac arrhythmia, anxiety and migraines who follows up for migraines.  UPDATE: She tried Reyvow.  *** Intensity:  *** Duration:  *** Frequency:  ***  Rescue protocol:  acetaminophen 500mg  powder, ibuprofen 800mg  second line Current NSAIDS/analgesics:  acetaminophen 500mg  powder, ibuprofen 800mg  Current triptans:  none Current ergotamine:  none Current anti-emetic:  ondansetron 4mg  Current muscle relaxants:  none Current Antihypertensive medications:  metoprolol succinate, verapamil 120mg  daily Current Antidepressant medications:  none Current Anticonvulsant medications:  none Current anti-CGRP:  Nurtec QOD Current Vitamins/Herbal/Supplements:  none Current Antihistamines/Decongestants:  none Other therapy:  none Hormone/birth control:  none  Caffeine:  No coffee.  Tea daily Diet:  does not drink much water.  Eats 2 meals a day Exercise:  walks daily Depression:  denies; Anxiety:  may get aggravated easily Other pain:  jaw pain, pain in feet (arthritis) Sleep hygiene:  varies  HISTORY:  Onset:  56 years old but resolved.  Returned in her 30s.  Worse in 2022 Location:  orbital, bi-temporal Quality:  eyes burn, pounding Intensity:  8-9/10.   Aura:  absent Prodrome:  absent Associated symptoms:  Nausea,  photophobia, phonophobia, blurred vision.  She denies associated unilateral numbness or weakness. Duration:  off and on for week - since starting Nurtec, 2 days Frequency:  once a week - Started Nurtec and now gets about 2 a month Frequency of abortive medication: acetaminophen or ibuprofen several times a week due to generalized pain. Triggers:  stress, certain smells (dead leaves), jaw problems (wears dentures) Relieving factors:  rest in dark and quiet room Activity:  aggravates   She has had an eye exam this past year. CT head on 11/15/2015 was negative.      Past NSAIDS/analgesics:  ibuprofen 800mg , acetaminophen, Fioricet, celecoxib, diclofenac, meloxicam Past abortive triptans:  none Past abortive ergotamine:  none Past muscle relaxants:  cyclobenzaprine, methocarbamol Past anti-emetic:  promethazine Past antihypertensive medications:  none Past antidepressant medications:  none Past anticonvulsant medications:  topiramate Past anti-CGRP:  Ubrelvy 100mg , Ajovy (injection site reaction) Past vitamins/Herbal/Supplements:  none Past antihistamines/decongestants:  Flonase Other past therapies:  none    Family history of headache:  mom (migraines) Works - cleaning and walking  PAST MEDICAL HISTORY: Past Medical History:  Diagnosis Date   Anxiety disorder 01/01/2021   Cardiac arrhythmia 01/01/2021   Chest pain    Chest pain of uncertain etiology 01/01/2021   Epigastric pain 01/01/2021   Essential hypertension 01/01/2021   Family history of coronary artery disease    Fever blister    Gastro-esophageal reflux disease without esophagitis 01/01/2021   GERD (gastroesophageal reflux disease)    Hepatic steatosis    High cholesterol    Hypertension    Migraines    Mixed hyperlipidemia 01/01/2021   Other long term (current)  drug therapy 01/01/2021   Palpitations 01/01/2021   Primary osteoarthritis 01/01/2021   Refractory migraine with aura 01/01/2021   Vitamin D deficiency     Wrist pain 03/13/2013    MEDICATIONS: Current Outpatient Medications on File Prior to Visit  Medication Sig Dispense Refill   metoprolol succinate (TOPROL-XL) 50 MG 24 hr tablet Take 50 mg by mouth daily.     nitroGLYCERIN (NITROSTAT) 0.4 MG SL tablet Place 0.4 mg under the tongue every 5 (five) minutes as needed for chest pain.     NURTEC 75 MG TBDP Take 1 tablet by mouth every other day.     pantoprazole (PROTONIX) 40 MG tablet Take 40 mg by mouth.     rosuvastatin (CRESTOR) 20 MG tablet Take 20 mg by mouth daily.     verapamil (CALAN) 120 MG tablet Take 120 mg by mouth daily.     No current facility-administered medications on file prior to visit.    ALLERGIES: Allergies  Allergen Reactions   Hydrocodone     Other reaction(s): sick on stomach   Tramadol Hcl     Other reaction(s): hives    FAMILY HISTORY: Family History  Problem Relation Age of Onset   Heart attack Mother    Heart attack Father    Stroke Brother    Heart attack Brother       Objective:  *** General: No acute distress.  Patient appears well-groomed.   Head:  Normocephalic/atraumatic Eyes:  Fundi examined but not visualized Neck: supple, no paraspinal tenderness, full range of motion Heart:  Regular rate and rhythm Neurological Exam: alert and oriented to person, place, and time.  Speech fluent and not dysarthric, language intact.  CN II-XII intact. Bulk and tone normal, muscle strength 5/5 throughout.  Sensation to light touch intact.  Deep tendon reflexes 2+ throughout, toes downgoing.  Finger to nose testing intact.  Gait normal, Romberg negative.   Shon Millet, DO

## 2021-12-22 ENCOUNTER — Ambulatory Visit (INDEPENDENT_AMBULATORY_CARE_PROVIDER_SITE_OTHER): Payer: BC Managed Care – PPO | Admitting: Neurology

## 2021-12-22 ENCOUNTER — Encounter: Payer: Self-pay | Admitting: Neurology

## 2021-12-22 VITALS — BP 133/84 | HR 80 | Resp 20 | Ht 63.0 in | Wt 170.0 lb

## 2021-12-22 DIAGNOSIS — G43009 Migraine without aura, not intractable, without status migrainosus: Secondary | ICD-10-CM

## 2021-12-22 NOTE — Progress Notes (Signed)
Medication Samples have been provided to the patient.  Drug name: Zavzpret       Strength: 10 mg        Qty: 2  LOT: [id number]  Exp.Date: 06/2023  Dosing instructions: as needed  The patient has been instructed regarding the correct time, dose, and frequency of taking this medication, including desired effects and most common side effects.   Leida Lauth 8:59 AM 12/22/2021

## 2021-12-22 NOTE — Patient Instructions (Signed)
When you get a migraine, try the Zavzpret nasal spray - 1 spray in one nostril once in 24 hours.  If effective, let me know Nurtec every other day Follow up 9 months.

## 2021-12-23 ENCOUNTER — Ambulatory Visit (INDEPENDENT_AMBULATORY_CARE_PROVIDER_SITE_OTHER): Payer: BC Managed Care – PPO

## 2021-12-23 ENCOUNTER — Ambulatory Visit (INDEPENDENT_AMBULATORY_CARE_PROVIDER_SITE_OTHER): Payer: BC Managed Care – PPO | Admitting: Podiatry

## 2021-12-23 DIAGNOSIS — Q6671 Congenital pes cavus, right foot: Secondary | ICD-10-CM | POA: Diagnosis not present

## 2021-12-23 DIAGNOSIS — M778 Other enthesopathies, not elsewhere classified: Secondary | ICD-10-CM | POA: Diagnosis not present

## 2021-12-23 DIAGNOSIS — M7750 Other enthesopathy of unspecified foot: Secondary | ICD-10-CM

## 2021-12-23 DIAGNOSIS — Q6672 Congenital pes cavus, left foot: Secondary | ICD-10-CM

## 2021-12-23 DIAGNOSIS — M7742 Metatarsalgia, left foot: Secondary | ICD-10-CM | POA: Diagnosis not present

## 2021-12-23 DIAGNOSIS — M7741 Metatarsalgia, right foot: Secondary | ICD-10-CM | POA: Diagnosis not present

## 2021-12-28 NOTE — Progress Notes (Signed)
  Subjective:  Patient ID: Kerri Jackson, female    DOB: Mar 25, 1966,  MRN: [id number]  Chief Complaint  Patient presents with   Foot Pain    np re est care/ bil foot pain/ discuss possible orthotics/ Patient states that the last time she saw Dr. Marylene Jackson, she was told there was nothing else we could do for her foot pain. Requesting new xrays today and another opinion. She has an appointment 01/05/22 with vein and vascular to check her circulation     56 y.o. female presents with the above complaint. History confirmed with patient.   Objective:  Physical Exam: warm, good capillary refill, no trophic changes or ulcerative lesions, normal DP and PT pulses, normal sensory exam, and cavus foot type, there is vague diffuse tenderness across the ball of the foot nonspecific no evidence of neuroma or plantar plate dysfunction   Radiographs: Multiple views x-ray of both feet: no fracture, dislocation, swelling or degenerative changes noted and pes cavus foot type noted, previous bunionectomy Assessment:   1. Tendonitis of ankle or foot      Plan:  Patient was evaluated and treated and all questions answered.  Reviewed her radiographs in detail.  We discussed her high arched foot type contributes to increased plantar forefoot pressure and pain.  Recommended custom molded orthoses to offload the forefoot and support her significantly high arch.  These have been helpful for her before.  She was fitted for these today will be called when they are ready for pickup.  No follow-ups on file.

## 2021-12-29 ENCOUNTER — Encounter: Payer: BC Managed Care – PPO | Admitting: Surgery

## 2021-12-29 ENCOUNTER — Encounter (HOSPITAL_COMMUNITY): Payer: BC Managed Care – PPO

## 2022-01-05 ENCOUNTER — Ambulatory Visit (HOSPITAL_COMMUNITY)
Admission: RE | Admit: 2022-01-05 | Discharge: 2022-01-05 | Disposition: A | Discharge status: Home / Self Care | Payer: BC Managed Care – PPO | Source: Ambulatory Visit | Attending: Surgery | Admitting: Surgery

## 2022-01-05 ENCOUNTER — Encounter: Payer: Self-pay | Admitting: Surgery

## 2022-01-05 ENCOUNTER — Ambulatory Visit (INDEPENDENT_AMBULATORY_CARE_PROVIDER_SITE_OTHER): Payer: BC Managed Care – PPO | Admitting: Surgery

## 2022-01-05 VITALS — BP 144/88 | HR 70 | Temp 97.9°F | Resp 20 | Ht 63.0 in | Wt 167.5 lb

## 2022-01-05 DIAGNOSIS — M25561 Pain in right knee: Secondary | ICD-10-CM

## 2022-01-05 DIAGNOSIS — M25562 Pain in left knee: Secondary | ICD-10-CM

## 2022-01-05 DIAGNOSIS — M25569 Pain in unspecified knee: Secondary | ICD-10-CM | POA: Diagnosis present

## 2022-01-05 NOTE — Progress Notes (Signed)
[facility]  Patient name: Kerri Jackson MRN: [id number] DOB: [birth year] Sex: female   REQUESTING PROVIDER:    Lupe Carney   REASON FOR CONSULT:    PAD  HISTORY OF PRESENT ILLNESS:   Kerri Jackson is a 56 y.o. female, who is referred for evaluation of leg pain.  She states that this has been going on for at least 10 years.  She describes pain in her hips as well as in her feet.  She will get occasional cramping.  She is now walking on her tiptoes.  She does have occasional ankle edema.  Her symptoms are aggravated by walking.  The patient medically managed for hypertension.  She takes a statin for hypercholesterolemia.  She is a non-smoker.  PAST MEDICAL HISTORY    Past Medical History:  Diagnosis Date   Anxiety disorder 01/01/2021   Cardiac arrhythmia 01/01/2021   Chest pain    Chest pain of uncertain etiology [id number]   Epigastric pain 01/01/2021   Essential hypertension 01/01/2021   Family history of coronary artery disease    Fever blister    Gastro-esophageal reflux disease without esophagitis 01/01/2021   GERD (gastroesophageal reflux disease)    Hepatic steatosis    High cholesterol    Hypertension    Migraines    Mixed hyperlipidemia 01/01/2021   Other long term (current) drug therapy 01/01/2021   Palpitations 01/01/2021   Primary osteoarthritis 01/01/2021   Refractory migraine with aura 01/01/2021   Vitamin D deficiency    Wrist pain 03/13/2013     FAMILY HISTORY   Family History  Problem Relation Age of Onset   Heart attack Mother    Heart attack Father    Stroke Brother    Heart attack Brother     SOCIAL HISTORY:   Social History   Socioeconomic History   Marital status: Divorced    Spouse name: Not on file   Number of children: Not on file   Years of education: Not on file   Highest education level: Not on file  Occupational History   Not on file  Tobacco Use   Smoking status:  Never   Smokeless tobacco: Never  Substance and Sexual Activity   Alcohol use: No   Drug use: No   Sexual activity: Not on file  Other Topics Concern   Not on file  Social History Narrative   Right handed   Drinks caffeine   Split level home   Social Determinants of Health   Financial Resource Strain: Not on file  Food Insecurity: Not on file  Transportation Needs: Not on file  Physical Activity: Not on file  Stress: Not on file  Social Connections: Not on file  Intimate Partner Violence: Not on file    ALLERGIES:    Allergies  Allergen Reactions   Hydrocodone     Other reaction(s): sick on stomach   Tramadol Hcl     Other reaction(s): hives    CURRENT MEDICATIONS:    Current Outpatient Medications  Medication Sig Dispense Refill   cetirizine (ZYRTEC) 10 MG tablet Take 10 mg by mouth daily.     diclofenac Sodium (VOLTAREN) 1 % GEL Apply topically.     gabapentin (NEURONTIN) 300 MG capsule Take 300 mg by mouth 2 (two) times daily.     meloxicam (MOBIC) 7.5 MG tablet Take 7.5 mg by mouth 2 (two) times daily.     metoprolol succinate (TOPROL-XL) 50 MG 24 hr tablet Take 50  mg by mouth daily.     NURTEC 75 MG TBDP Take 1 tablet by mouth every other day.     pantoprazole (PROTONIX) 40 MG tablet Take 40 mg by mouth.     rosuvastatin (CRESTOR) 20 MG tablet Take 20 mg by mouth daily.     verapamil (CALAN) 120 MG tablet Take 120 mg by mouth daily.     nitroGLYCERIN (NITROSTAT) 0.4 MG SL tablet Place 0.4 mg under the tongue every 5 (five) minutes as needed for chest pain. (Patient not taking: Reported on 12/22/2021)     No current facility-administered medications for this visit.    REVIEW OF SYSTEMS:   [X]  denotes positive finding, [ ]  denotes negative finding Cardiac  Comments:  Chest pain or chest pressure:    Shortness of breath upon exertion:    Short of breath when lying flat:    Irregular heart rhythm:        Vascular    Pain in calf, thigh, or hip brought  on by ambulation:    Pain in feet at night that wakes you up from your sleep:     Blood clot in your veins:    Leg swelling:         Pulmonary    Oxygen at home:    Productive cough:     Wheezing:         Neurologic    Sudden weakness in arms or legs:     Sudden numbness in arms or legs:     Sudden onset of difficulty speaking or slurred speech:    Temporary loss of vision in one eye:     Problems with dizziness:         Gastrointestinal    Blood in stool:      Vomited blood:         Genitourinary    Burning when urinating:     Blood in urine:        Psychiatric    Major depression:         Hematologic    Bleeding problems:    Problems with blood clotting too easily:        Skin    Rashes or ulcers:        Constitutional    Fever or chills:     PHYSICAL EXAM:   Vitals:   01/05/22 1026  BP: (!) 144/88  Pulse: 70  Resp: 20  Temp: 97.9 F (36.6 C)  SpO2: 98%  Weight: 167 lb 8 oz (76 kg)  Height: 5\' 3"  (1.6 m)    GENERAL: The patient is a well-nourished female, in no acute distress. The vital signs are documented above. CARDIAC: There is a regular rate and rhythm.  VASCULAR: Palpable pedal pulses bilaterally PULMONARY: Nonlabored respirations MUSCULOSKELETAL: There are no major deformities or cyanosis. NEUROLOGIC: No focal weakness or paresthesias are detected. SKIN: There are no ulcers or rashes noted. PSYCHIATRIC: The patient has a normal affect.  STUDIES:   I have reviewed the following: +-------+-----------+-----------+------------+------------+  ABI/TBIToday's ABIToday's TBIPrevious ABIPrevious TBI  +-------+-----------+-----------+------------+------------+  Right  1.09       0.65                                 +-------+-----------+-----------+------------+------------+  Left   1.16       0.80                                 +-------+-----------+-----------+------------+------------+  right toe pressure: 104 Left toe  pressure: 128 Waveforms are triphasic  ASSESSMENT and PLAN   Leg pain: I discussed with the patient that she has palpable pedal pulses and normal ankle-brachial indices and so I do not think that arterial insufficiency is the etiology of her pain.     Charlena Cross, MD, FACS [facility] [phone] Pager [phone]

## 2022-01-06 ENCOUNTER — Ambulatory Visit: Payer: BC Managed Care – PPO | Admitting: Podiatrist

## 2022-01-06 ENCOUNTER — Telehealth: Payer: Self-pay | Admitting: Podiatry

## 2022-01-06 NOTE — Telephone Encounter (Signed)
Left message on voicemail to call back to schedule picking up orthotics

## 2022-01-15 ENCOUNTER — Ambulatory Visit (INDEPENDENT_AMBULATORY_CARE_PROVIDER_SITE_OTHER): Payer: BC Managed Care – PPO | Admitting: *Deleted

## 2022-01-15 DIAGNOSIS — Q6672 Congenital pes cavus, left foot: Secondary | ICD-10-CM

## 2022-01-15 DIAGNOSIS — Q6671 Congenital pes cavus, right foot: Secondary | ICD-10-CM

## 2022-01-15 NOTE — Progress Notes (Signed)
Patient presents today to pick up custom molded foot orthotics, diagnosed with pes cavus by Dr. Irving Shows.   Orthotics were dispensed and fit was satisfactory. Reviewed instructions for break-in and wear. Written instructions given to patient.  Patient will follow up as needed.   [facility] - order # [id number]

## 2022-06-05 ENCOUNTER — Other Ambulatory Visit: Payer: Self-pay | Admitting: Family

## 2022-06-05 DIAGNOSIS — Z1231 Encounter for screening mammogram for malignant neoplasm of breast: Secondary | ICD-10-CM

## 2022-06-09 ENCOUNTER — Other Ambulatory Visit: Payer: Self-pay | Admitting: Family

## 2022-06-09 DIAGNOSIS — E2839 Other primary ovarian failure: Secondary | ICD-10-CM

## 2022-06-15 ENCOUNTER — Other Ambulatory Visit: Payer: BC Managed Care – PPO

## 2022-06-23 ENCOUNTER — Encounter: Payer: Self-pay | Admitting: Gastroenterology

## 2022-08-07 ENCOUNTER — Ambulatory Visit
Admission: RE | Admit: 2022-08-07 | Discharge: 2022-08-07 | Disposition: A | Discharge status: Home / Self Care | Payer: BC Managed Care – PPO | Source: Ambulatory Visit | Attending: Family | Admitting: Family

## 2022-08-07 DIAGNOSIS — Z1231 Encounter for screening mammogram for malignant neoplasm of breast: Secondary | ICD-10-CM

## 2022-09-21 NOTE — Progress Notes (Deleted)
NEUROLOGY FOLLOW UP OFFICE NOTE  Di Jasmer [id number]  Assessment/Plan:   Migraine without aura, without status migrainosus, not intractable    Migraine prevention:  Nurtec 75mg  every other day *** Migraine rescue: *** At this time, I would like to avoid triptans due to history of cardiac arrhythmia and atypical chest pain with CAD risk factors. Zofran for nausea. Limit use of pain relievers to no more than 2 days out of week to prevent risk of rebound or medication-overuse headache. Keep headache diary Follow up ***.       Subjective:  Sua Spadafora is a 57 year old right-handed female with HTN, HLD, GERD, cardiac arrhythmia, anxiety and migraines who follows up for migraines.   UPDATE: She tried Zavzpret ***  Intensity:  5-6/10 Duration:  few hours Frequency:  2-3 a month   Rescue protocol:  acetaminophen 500mg  powder, ibuprofen 800mg  second line Current NSAIDS/analgesics:  acetaminophen 500mg  powder, ibuprofen 800mg  Current triptans:  none Current ergotamine:  none Current anti-emetic:  ondansetron 4mg  Current muscle relaxants:  none Current Antihypertensive medications:  metoprolol succinate, verapamil 120mg  daily Current Antidepressant medications:  none Current Anticonvulsant medications:  none Current anti-CGRP:  Nurtec QOD Current Vitamins/Herbal/Supplements:  none Current Antihistamines/Decongestants:  Zyrtec Other therapy:  ice cap for the head Hormone/birth control:  none   Caffeine:  No coffee.  Tea daily Diet:  does not drink much water.  Eats 2 meals a day Exercise:  walks daily Depression:  denies; Anxiety:  may get aggravated easily Other pain:  jaw pain, pain in feet (arthritis) Sleep hygiene:  varies   HISTORY:  Onset:  57 years old but resolved.  Returned in her 30s.  Worse in 2022 Location:  orbital, bi-temporal, band-like Quality:  eyes burn, pounding Intensity:  8-9/10.   Aura:  absent Prodrome:  absent Associated symptoms:  Nausea,  photophobia, phonophobia, blurred vision.  She denies associated unilateral numbness or weakness. Duration:  off and on for week - since starting Nurtec, 2 days Frequency:  once a week - Started Nurtec and now gets about 2 a month Frequency of abortive medication: acetaminophen or ibuprofen several times a week due to generalized pain. Triggers:  stress, certain smells (dead leaves), jaw problems (wears dentures) Relieving factors:  rest in dark and quiet room Activity:  aggravates   She has had an eye exam this past year. CT head on 11/15/2015 was negative.       Past NSAIDS/analgesics:  ibuprofen 800mg , acetaminophen, Fioricet, celecoxib, diclofenac, meloxicam Past abortive triptans:  none Past abortive ergotamine:  none Past muscle relaxants:  cyclobenzaprine, methocarbamol Past anti-emetic:  promethazine Past antihypertensive medications:  none Past antidepressant medications:  none Past anticonvulsant medications:  topiramate Past anti-CGRP:  Ubrelvy 100mg , Ajovy (injection site reaction) Past vitamins/Herbal/Supplements:  none Past antihistamines/decongestants:  Flonase Other past therapies:  Reyvow (sleepiness - cannot take at work)     Family history of headache:  mom (migraines) Works Editor, commissioning - Education officer, environmental and walking  PAST MEDICAL HISTORY: Past Medical History:  Diagnosis Date   Anxiety disorder 01/01/2021   Cardiac arrhythmia 01/01/2021   Chest pain    Chest pain of uncertain etiology 01/01/2021   Epigastric pain 01/01/2021   Essential hypertension 01/01/2021   Family history of coronary artery disease    Fever blister    Gastro-esophageal reflux disease without esophagitis 01/01/2021   GERD (gastroesophageal reflux disease)    Hepatic steatosis    High cholesterol    Hypertension    Migraines  Mixed hyperlipidemia 01/01/2021   Other long term (current) drug therapy 01/01/2021   Palpitations 01/01/2021   Primary osteoarthritis 01/01/2021   Refractory migraine with  aura 01/01/2021   Vitamin D deficiency    Wrist pain 03/13/2013    MEDICATIONS: Current Outpatient Medications on File Prior to Visit  Medication Sig Dispense Refill   cetirizine (ZYRTEC) 10 MG tablet Take 10 mg by mouth daily.     diclofenac Sodium (VOLTAREN) 1 % GEL Apply topically.     gabapentin (NEURONTIN) 300 MG capsule Take 300 mg by mouth 2 (two) times daily.     meloxicam (MOBIC) 7.5 MG tablet Take 7.5 mg by mouth 2 (two) times daily.     metoprolol succinate (TOPROL-XL) 50 MG 24 hr tablet Take 50 mg by mouth daily.     nitroGLYCERIN (NITROSTAT) 0.4 MG SL tablet Place 0.4 mg under the tongue every 5 (five) minutes as needed for chest pain. (Patient not taking: Reported on 12/22/2021)     NURTEC 75 MG TBDP Take 1 tablet by mouth every other day.     pantoprazole (PROTONIX) 40 MG tablet Take 40 mg by mouth.     rosuvastatin (CRESTOR) 20 MG tablet Take 20 mg by mouth daily.     verapamil (CALAN) 120 MG tablet Take 120 mg by mouth daily.     No current facility-administered medications on file prior to visit.    ALLERGIES: Allergies  Allergen Reactions   Hydrocodone     Other reaction(s): sick on stomach   Tramadol Hcl     Other reaction(s): hives    FAMILY HISTORY: Family History  Problem Relation Age of Onset   Heart attack Mother    Heart attack Father    Stroke Brother    Heart attack Brother    Breast cancer Neg Hx       Objective:  *** General: No acute distress.  Patient appears well-groomed.   Head:  Normocephalic/atraumatic Eyes:  Fundi examined but not visualized Neck: supple, no paraspinal tenderness, full range of motion Heart:  Regular rate and rhythm Neurological Exam: alert and oriented to person, place, and time.  Speech fluent and not dysarthric, language intact.  CN II-XII intact. Bulk and tone normal, muscle strength 5/5 throughout.  Sensation to light touch intact.  Deep tendon reflexes 2+ throughout.  Finger to nose testing intact.  Gait  normal, Romberg negative.   Shon Millet, DO  CC: ***

## 2022-09-22 ENCOUNTER — Ambulatory Visit: Payer: BC Managed Care – PPO | Admitting: Neurology

## 2022-09-22 ENCOUNTER — Encounter: Payer: Self-pay | Admitting: Neurology

## 2022-09-22 DIAGNOSIS — Z029 Encounter for administrative examinations, unspecified: Secondary | ICD-10-CM

## 2022-10-02 ENCOUNTER — Other Ambulatory Visit: Payer: Self-pay | Admitting: Neurology

## 2022-10-02 ENCOUNTER — Telehealth: Payer: Self-pay | Admitting: Neurology

## 2022-10-02 MED ORDER — NURTEC 75 MG PO TBDP: 1.0000 | ORAL_TABLET | ORAL | 3 refills | Status: DC

## 2022-10-02 NOTE — Telephone Encounter (Signed)
Patient no show at last appointment and is scheduled to come in an d see you in June

## 2022-10-02 NOTE — Telephone Encounter (Signed)
Pt came in wanting to see if Dr. Everlena Cooper could send her in a prescription for her headaches. She stated for in between headaches. Her pharmacy is [facility] in [place]

## 2022-10-29 NOTE — Progress Notes (Signed)
NEUROLOGY FOLLOW UP OFFICE NOTE  Kerri Jackson [id number]  Assessment/Plan:   Migraine without aura, without status migrainosus, not intractable    Migraine prevention:  Stop Nurtec.  Start [employer]  Would not use Aimovig due to elevated blood pressure Migraine rescue:  Stop Fioricet with Codeine.  She will try sample of Reyvow 100mg   At this time, I would like to avoid triptans due to history of cardiac arrhythmia and atypical chest pain with CAD risk factors. Zofran for nausea. Limit use of pain relievers to no more than 2 days out of week to prevent risk of rebound or medication-overuse headache. Keep headache diary Follow up 6 months       Subjective:  Kerri Jackson is a 57 year old right-handed female with HTN, HLD, GERD, cardiac arrhythmia, anxiety and migraines who follows up for migraines.  UPDATE: Tried Zavzpret but it caused elevated blood pressure.  Prescribed Fioricet by Urgent Care which helps. Reports worsening migraines beginning in January.  Has been to Urgent Care several times.  She says it is related to changes in weather and increased stress. Intensity:  5-6/10 Duration:  few hours with Fioricet.   Frequency:  10-12 days a month.   Current NSAIDS/analgesics:  Fioricet with codeine, acetaminophen 500mg  powder Current triptans:  none Current ergotamine:  none Current anti-emetic:  ondansetron 4mg  Current muscle relaxants:  none Current Antihypertensive medications:  metoprolol succinate, verapamil 120mg  daily Current Antidepressant medications:  none Current Anticonvulsant medications:  none Current anti-CGRP:  Nurtec QOD Current Vitamins/Herbal/Supplements:  none Current Antihistamines/Decongestants:  Zyrtec Other therapy:  ice cap for the head Hormone/birth control:  none  Caffeine:  No coffee.  Tea daily Diet:  does not drink much water.  Eats 2 meals a day Exercise:  walks daily Depression:  denies; Anxiety:  may get aggravated easily Other pain:   jaw pain, pain in feet (arthritis) Sleep hygiene:  varies  HISTORY:  Onset:  57 years old but resolved.  Returned in her 30s.  Worse in 2022 Location:  orbital, bi-temporal, band-like Quality:  eyes burn, pounding Intensity:  8-9/10.   Aura:  absent Prodrome:  absent Associated symptoms:  Nausea, photophobia, phonophobia, blurred vision.  She denies associated unilateral numbness or weakness. Duration:  off and on for week - since starting Nurtec, 2 days Frequency:  once a week - Started Nurtec and now gets about 2 a month Frequency of abortive medication: acetaminophen or ibuprofen several times a week due to generalized pain. Triggers:  stress, certain smells (dead leaves), jaw problems (wears dentures) Relieving factors:  rest in dark and quiet room Activity:  aggravates   She has had an eye exam this past year. CT head on 11/15/2015 was negative.      Past NSAIDS/analgesics:  ibuprofen 800mg , celecoxib, diclofenac, meloxicam Past abortive triptans:  none Past abortive ergotamine:  none Past muscle relaxants:  cyclobenzaprine, methocarbamol Past anti-emetic:  promethazine Past antihypertensive medications:  none Past antidepressant medications:  none Past anticonvulsant medications:  topiramate Past anti-CGRP:  Ubrelvy 100mg , Ajovy (injection site reaction), Zavzpret NS Past vitamins/Herbal/Supplements:  none Past antihistamines/decongestants:  Flonase Other past therapies:  Reyvow (sleepiness - cannot take at work)    Family history of headache:  mom (migraines) Works Editor, commissioning - Education officer, environmental and walking  PAST MEDICAL HISTORY: Past Medical History:  Diagnosis Date   Anxiety disorder 01/01/2021   Cardiac arrhythmia 01/01/2021   Chest pain    Chest pain of uncertain etiology 01/01/2021   Epigastric pain 01/01/2021  Essential hypertension 01/01/2021   Family history of coronary artery disease    Fever blister    Gastro-esophageal reflux disease without esophagitis 01/01/2021    GERD (gastroesophageal reflux disease)    Hepatic steatosis    High cholesterol    Hypertension    Migraines    Mixed hyperlipidemia 01/01/2021   Other long term (current) drug therapy 01/01/2021   Palpitations 01/01/2021   Primary osteoarthritis 01/01/2021   Refractory migraine with aura 01/01/2021   Vitamin D deficiency    Wrist pain 03/13/2013    MEDICATIONS: Current Outpatient Medications on File Prior to Visit  Medication Sig Dispense Refill   cetirizine (ZYRTEC) 10 MG tablet Take 10 mg by mouth daily.     diclofenac Sodium (VOLTAREN) 1 % GEL Apply topically.     gabapentin (NEURONTIN) 300 MG capsule Take 300 mg by mouth 2 (two) times daily.     meloxicam (MOBIC) 7.5 MG tablet Take 7.5 mg by mouth 2 (two) times daily.     metoprolol succinate (TOPROL-XL) 50 MG 24 hr tablet Take 50 mg by mouth daily.     nitroGLYCERIN (NITROSTAT) 0.4 MG SL tablet Place 0.4 mg under the tongue every 5 (five) minutes as needed for chest pain. (Patient not taking: Reported on 12/22/2021)     NURTEC 75 MG TBDP Take 1 tablet (75 mg total) by mouth every other day. 16 tablet 3   pantoprazole (PROTONIX) 40 MG tablet Take 40 mg by mouth.     rosuvastatin (CRESTOR) 20 MG tablet Take 20 mg by mouth daily.     verapamil (CALAN) 120 MG tablet Take 120 mg by mouth daily.     No current facility-administered medications on file prior to visit.    ALLERGIES: Allergies  Allergen Reactions   Hydrocodone     Other reaction(s): sick on stomach   Tramadol Hcl     Other reaction(s): hives    FAMILY HISTORY: Family History  Problem Relation Age of Onset   Heart attack Mother    Heart attack Father    Stroke Brother    Heart attack Brother    Breast cancer Neg Hx       Objective:  Blood pressure (!) 144/87, pulse 79, height 5\' 3"  (1.6 m), weight 163 lb 12.8 oz (74.3 kg), SpO2 96 %. General: No acute distress.  Patient appears well-groomed.   Head:  Normocephalic/atraumatic Neck:  Supple.  No  paraspinal tenderness.  Full range of motion. Heart:  Regular rate and rhythm. Neuro:  Alert and oriented.  Speech fluent and not dysarthric.  Language intact.  Right amblyopia (reduced abduction).  Otherwise, CN II-XII intact.  Bulk and tone normal.  Muscle strength 5/5 throughout.  Deep tendon reflexes 2+ throughout.  Gait normal.  Romberg negative.   Shon Millet, DO

## 2022-11-02 ENCOUNTER — Ambulatory Visit (INDEPENDENT_AMBULATORY_CARE_PROVIDER_SITE_OTHER): Payer: BC Managed Care – PPO | Admitting: Neurology

## 2022-11-02 ENCOUNTER — Other Ambulatory Visit (HOSPITAL_COMMUNITY): Payer: Self-pay

## 2022-11-02 ENCOUNTER — Encounter: Payer: Self-pay | Admitting: Neurology

## 2022-11-02 ENCOUNTER — Telehealth: Payer: Self-pay | Admitting: Pharmacy Technician

## 2022-11-02 VITALS — BP 144/87 | HR 79 | Ht 63.0 in | Wt 163.8 lb

## 2022-11-02 DIAGNOSIS — G43009 Migraine without aura, not intractable, without status migrainosus: Secondary | ICD-10-CM | POA: Diagnosis not present

## 2022-11-02 MED ORDER — EMGALITY 120 MG/ML ~~LOC~~ SOAJ: 240.0000 mg | Freq: Once | SUBCUTANEOUS | 0 refills | Status: AC

## 2022-11-02 NOTE — Telephone Encounter (Signed)
Patient Advocate Encounter  Prior Authorization for [facility]  has been approved with [employer].    PA# [id number] Effective dates: 6.10.24 through 9.8.24  Per [facility] test claim, copay for 28 days (LOADING DOSE) supply is $75   Received notification from [employer] that prior authorization for [facility] 120MG  is required.   PA submitted on 6.10.24 Key [id number] Status is pending

## 2022-11-02 NOTE — Patient Instructions (Addendum)
Stop Nurtec.  Start [employer] - 2 injections for first dose, then 1 injection every 28 days thereafter.  After you pick up the first dose (2 pens), contact me and I will send in standing order. Stop the butalbital pill.  Take Reyvow at earliest onset of headache.  No more than 1 pill in 24 hours.  Advise not to drive for 8 hours after use.  Let me know if it is effective. Limit use of pain relievers to no more than 2 days out of week to prevent risk of rebound or medication-overuse headache. Keep headache diary Follow up 6 months.

## 2023-02-16 ENCOUNTER — Ambulatory Visit: Payer: BC Managed Care – PPO

## 2023-02-16 VITALS — BP 128/74 | HR 83 | Ht 63.0 in | Wt 161.0 lb

## 2023-02-16 DIAGNOSIS — R0609 Other forms of dyspnea: Secondary | ICD-10-CM

## 2023-02-16 DIAGNOSIS — R072 Precordial pain: Secondary | ICD-10-CM

## 2023-02-16 DIAGNOSIS — I10 Essential (primary) hypertension: Secondary | ICD-10-CM

## 2023-02-16 DIAGNOSIS — R079 Chest pain, unspecified: Secondary | ICD-10-CM

## 2023-02-16 MED ORDER — METOPROLOL TARTRATE 50 MG PO TABS: ORAL_TABLET | ORAL | 0 refills | Status: DC

## 2023-02-16 MED ORDER — NITROGLYCERIN 0.4 MG SL SUBL: 0.4000 mg | SUBLINGUAL_TABLET | SUBLINGUAL | 11 refills | Status: AC | PRN

## 2023-02-16 MED ORDER — ASPIRIN 81 MG PO TBEC: 81.0000 mg | DELAYED_RELEASE_TABLET | Freq: Every day | ORAL | Status: AC

## 2023-02-16 NOTE — Assessment & Plan Note (Signed)
Symptoms not classic for cardiac origin.  Also associated with sensation of not getting enough air, suggestive of dyspnea. She does have progressive symptoms occurring on a daily basis.  Will obtain a transthoracic echocardiogram to assess cardiac structure and function.  Will obtain a CTA coronary angiogram to rule out any significant obstructive coronary artery disease in the setting of symptoms suggestive of angina. Additional dose of metoprolol tartrate 50 mg on the morning of the test to optimize heart rates for the study. [She is already taking Toprol-XL 100 mg and verapamil to 40 mg dose.]  Advised her to avoid moderate to heavy exertion. Advised her to start taking aspirin 81 mg once daily.

## 2023-02-16 NOTE — Assessment & Plan Note (Signed)
Hypertension well-controlled. Currently remains on Toprol-XL 100 mg once daily and verapamil to 40 mg once daily.  She mentions this has been the regimen she has been on for years.  Target blood pressure below 130 over 80 mmHg

## 2023-02-16 NOTE — Addendum Note (Signed)
Addended by: Baldo Ash D on: 02/16/2023 09:35 AM   Modules accepted: Orders

## 2023-02-16 NOTE — Patient Instructions (Signed)
Medication Instructions:   TAKE: Metoprolol Tartrate 50mg  1 tablet in the AM of CT Scan  START: Aspirin 81mg  1 tablet daily    Lab Work: BMP -today If you have labs (blood work) drawn today and your tests are completely normal, you will receive your results only by: MyChart Message (if you have MyChart) OR A paper copy in the mail If you have any lab test that is abnormal or we need to change your treatment, we will call you to review the results.   Testing/Procedures:  Your physician has requested that you have an echocardiogram. Echocardiography is a painless test that uses sound waves to create images of your heart. It provides your doctor with information about the size and shape of your heart and how well your heart's chambers and valves are working. This procedure takes approximately one hour. There are no restrictions for this procedure. Please do NOT wear cologne, perfume, aftershave, or lotions (deodorant is allowed). Please arrive 15 minutes prior to your appointment time.    Your physician has requested that you have cardiac CT. Cardiac computed tomography (CT) is a painless test that uses an x-ray machine to take clear, detailed pictures of your heart. For further information please visit [email or web address] Please follow instruction sheet as given.    Your Cardiac CT will be scheduled at:   [facility] located off [facility] at the hospital.  Please arrive 30 minutes prior to your appointment time.  You can use the FREE valet parking offered at entrance to outpatient center (encouraged to control the heart rate for the test)   Please follow these instructions carefully (unless otherwise directed):    On the Night Before the Test: Be sure to Drink plenty of water. Do not consume any caffeinated/decaffeinated beverages or chocolate 12 hours prior to your test. Do not take any antihistamines 12 hours prior to your test.   On the Day of  the Test: Drink plenty of water until 1 hour prior to the test. Do not eat any food 4 hours prior to the test. No smoking 4 hours prior to test. You may take your regular medications prior to the test.  Take metoprolol (Lopressor)the morning of CT Scan FEMALES- please wear underwire-free bra if available, avoid dresses & tight clothing. Wear plain shirt no beads, sparkles, rhinestones, metal or heavy embroidery.  After the Test: Drink plenty of water. After receiving IV contrast, you may experience a mild flushed feeling. This is normal. On occasion, you may experience a mild rash up to 24 hours after the test. This is not dangerous. If this occurs, you can take Benadryl 25 mg and increase your fluid intake. If you experience trouble breathing, this can be serious. If it is severe call 911 IMMEDIATELY. If it is mild, please call our office. If you take any of these medications: Glipizide/Metformin, Avandament, Glucavance, please do not take 48 hours after completing test unless otherwise instructed.  We will call to schedule your test 2-4 weeks out understanding that some insurance companies will need an authorization prior to the service being performed.      Follow-Up: At [facility], you and your health needs are our priority.  As part of our continuing mission to provide you with exceptional heart care, we have created designated Provider Care Teams.  These Care Teams include your primary Cardiologist (physician) and Advanced Practice Providers (APPs -  Physician Assistants and Nurse Practitioners) who all work together to provide you with  the care you need, when you need it.  We recommend signing up for the patient portal called "MyChart".  Sign up information is provided on this After Visit Summary.  MyChart is used to connect with patients for Virtual Visits (Telemedicine).  Patients are able to view lab/test results, encounter notes, upcoming appointments, etc.  Non-urgent  messages can be sent to your provider as well.   To learn more about what you can do with MyChart, go to [email or web address].    Your next appointment:   Based on Test results  The format for your next appointment:     Provider:   Vern Claude Madireddy, MD    Other Instructions Cardiac CT Angiogram A cardiac CT angiogram is a procedure to look at the heart and the area around the heart. It may be done to help find the cause of chest pains or other symptoms of heart disease. During this procedure, a substance called contrast dye is injected into the blood vessels in the area to be checked. A large X-ray machine, called a CT scanner, then takes detailed pictures of the heart and the surrounding area. The procedure is also sometimes called a coronary CT angiogram, coronary artery scanning, or CTA. A cardiac CT angiogram allows the health care provider to see how well blood is flowing to and from the heart. The health care provider will be able to see if there are any problems, such as: Blockage or narrowing of the coronary arteries in the heart. Fluid around the heart. Signs of weakness or disease in the muscles, valves, and tissues of the heart. Tell a health care provider about: Any allergies you have. This is especially important if you have had a previous allergic reaction to contrast dye. All medicines you are taking, including vitamins, herbs, eye drops, creams, and over-the-counter medicines. Any blood disorders you have. Any surgeries you have had. Any medical conditions you have. Whether you are pregnant or may be pregnant. Any anxiety disorders, chronic pain, or other conditions you have that may increase your stress or prevent you from lying still. What are the risks? Generally, this is a safe procedure. However, problems may occur, including: Bleeding. Infection. Allergic reactions to medicines or dyes. Damage to other structures or organs. Kidney damage from the contrast  dye that is used. Increased risk of cancer from radiation exposure. This risk is low. Talk with your health care provider about: The risks and benefits of testing. How you can receive the lowest dose of radiation. What happens before the procedure? Wear comfortable clothing and remove any jewelry, glasses, dentures, and hearing aids. Follow instructions from your health care provider about eating and drinking. This may include: For 12 hours before the procedure -- avoid caffeine. This includes tea, coffee, soda, energy drinks, and diet pills. Drink plenty of water or other fluids that do not have caffeine in them. Being well hydrated can prevent complications. For 4-6 hours before the procedure -- stop eating and drinking. The contrast dye can cause nausea, but this is less likely if your stomach is empty. Ask your health care provider about changing or stopping your regular medicines. This is especially important if you are taking diabetes medicines, blood thinners, or medicines to treat problems with erections (erectile dysfunction). What happens during the procedure?  Hair on your chest may need to be removed so that small sticky patches called electrodes can be placed on your chest. These will transmit information that helps to monitor your heart during the procedure.  An IV will be inserted into one of your veins. You might be given a medicine to control your heart rate during the procedure. This will help to ensure that good images are obtained. You will be asked to lie on an exam table. This table will slide in and out of the CT machine during the procedure. Contrast dye will be injected into the IV. You might feel warm, or you may get a metallic taste in your mouth. You will be given a medicine called nitroglycerin. This will relax or dilate the arteries in your heart. The table that you are lying on will move into the CT machine tunnel for the scan. The person running the machine will give  you instructions while the scans are being done. You may be asked to: Keep your arms above your head. Hold your breath. Stay very still, even if the table is moving. When the scanning is complete, you will be moved out of the machine. The IV will be removed. The procedure may vary among health care providers and hospitals. What can I expect after the procedure? After your procedure, it is common to have: A metallic taste in your mouth from the contrast dye. A feeling of warmth. A headache from the nitroglycerin. Follow these instructions at home: Take over-the-counter and prescription medicines only as told by your health care provider. If you are told, drink enough fluid to keep your urine pale yellow. This will help to flush the contrast dye out of your body. Most people can return to their normal activities right after the procedure. Ask your health care provider what activities are safe for you. It is up to you to get the results of your procedure. Ask your health care provider, or the department that is doing the procedure, when your results will be ready. Keep all follow-up visits as told by your health care provider. This is important. Contact a health care provider if: You have any symptoms of allergy to the contrast dye. These include: Shortness of breath. Rash or hives. A racing heartbeat. Summary A cardiac CT angiogram is a procedure to look at the heart and the area around the heart. It may be done to help find the cause of chest pains or other symptoms of heart disease. During this procedure, a large X-ray machine, called a CT scanner, takes detailed pictures of the heart and the surrounding area after a contrast dye has been injected into blood vessels in the area. Ask your health care provider about changing or stopping your regular medicines before the procedure. This is especially important if you are taking diabetes medicines, blood thinners, or medicines to treat erectile  dysfunction. If you are told, drink enough fluid to keep your urine pale yellow. This will help to flush the contrast dye out of your body. This information is not intended to replace advice given to you by your health care provider. Make sure you discuss any questions you have with your health care provider. Document Revised: 08/28/2021 Document Reviewed: 01/04/2019 Elsevier Patient Education  2023 Elsevier Inc.   Important Information About Sugar

## 2023-02-17 ENCOUNTER — Ambulatory Visit: Payer: BC Managed Care – PPO

## 2023-02-17 DIAGNOSIS — R0609 Other forms of dyspnea: Secondary | ICD-10-CM | POA: Diagnosis not present

## 2023-02-17 LAB — ECHOCARDIOGRAM COMPLETE
Area-P 1/2: 4.17 cm2
S' Lateral: 2.3 cm

## 2023-02-26 ENCOUNTER — Telehealth: Payer: Self-pay

## 2023-02-26 NOTE — Telephone Encounter (Signed)
Pt viewed results on My Chart per Dr. Madireddy's note. Routed to PCP.

## 2023-03-01 ENCOUNTER — Telehealth: Payer: Self-pay

## 2023-03-01 MED ORDER — METOPROLOL TARTRATE 50 MG PO TABS: ORAL_TABLET | ORAL | 0 refills | Status: AC

## 2023-03-01 NOTE — Telephone Encounter (Signed)
Spoke with pt will be sending order back to [facility] to reschedule CT Angio. Pt aware and verbalized understanding on how to take Metoprolol Tartrate and Toprol XL.

## 2023-04-05 ENCOUNTER — Telehealth: Payer: Self-pay

## 2023-04-05 NOTE — Telephone Encounter (Signed)
Left voice mail for pt to call regarding CT angio results.

## 2023-04-05 NOTE — Telephone Encounter (Signed)
-----   Message from Marlyn Corporal Madireddy sent at 03/30/2023 10:19 AM EST ----- CTA coronary angiogram done 03-11-2023 at [facility] noted calcium score 375. Moderate stenosis of mid LAD [50 to 69%] lesion noted. CT FFR done showed low likelihood of hemodynamically significant stenosis.  Radiologist over read of extracardiac findings showed aortic atherosclerosis and no other significant abnormalities.  These results suggest moderate amount of plaque burden, with moderate degree of narrowing in the blood vessels without any significant obstruction to the flow.  I recommend continuing aspirin 81 mg once daily in addition to her current dose of rosuvastatin 20 mg once daily.  Please order a lipid panel, if LDL is not at goal below 70 mg/dL,  will titrate up dose of rosuvastatin to 40 mg a day.  Thank you.

## 2023-04-06 ENCOUNTER — Telehealth: Payer: Self-pay

## 2023-04-06 DIAGNOSIS — E782 Mixed hyperlipidemia: Secondary | ICD-10-CM

## 2023-04-06 NOTE — Telephone Encounter (Signed)
Spoke with pt about CT results per Dr. Madireddy's note. Pt verbalized understanding and had no further questions.

## 2023-05-05 NOTE — Progress Notes (Unsigned)
NEUROLOGY FOLLOW UP OFFICE NOTE  Danis Ciano [id number]  Assessment/Plan:   Migraine without aura, without status migrainosus, not intractable    Migraine prevention:  Emgality.   Migraine rescue:  *** I would like to avoid triptans due to history of cardiac arrhythmia and atypical chest pain with CAD risk factors. Zofran for nausea. Limit use of pain relievers to no more than 2 days out of week to prevent risk of rebound or medication-overuse headache. Keep headache diary Follow up 6 months       Subjective:  Marybeth Zumbrunnen is a 57 year old right-handed female with HTN, HLD, GERD, cardiac arrhythmia, anxiety and migraines who follows up for migraines.  UPDATE: Switched from [employer]	[place] to [employer] [place] *** *** Reports worsening migraines beginning in January.  Has been to Urgent Care several times.  She says it is related to changes in weather and increased stress. Intensity:  5-6/10 Duration:  few hours with Fioricet.   Frequency:  10-12 days a month.   Current NSAIDS/analgesics:  acetaminophen 500mg  powder Current triptans:  none Current ergotamine:  none Current anti-emetic:  ondansetron 4mg  Current muscle relaxants:  none Current Antihypertensive medications:  metoprolol succinate, verapamil 120mg  daily Current Antidepressant medications:  none Current Anticonvulsant medications:  none Current anti-CGRP:  Emgalilty Current Vitamins/Herbal/Supplements:  none Current Antihistamines/Decongestants:  Zyrtec Other therapy:  ice cap for the head Hormone/birth control:  none  Caffeine:  No coffee.  Tea daily Diet:  does not drink much water.  Eats 2 meals a day Exercise:  walks daily Depression:  denies; Anxiety:  may get aggravated easily Other pain:  jaw pain, pain in feet (arthritis) Sleep hygiene:  varies  HISTORY:  Onset:  57 years old but resolved.  Returned in her 30s.  Worse in 2022 Location:  orbital, bi-temporal, band-like Quality:  eyes burn,  pounding Intensity:  8-9/10.   Aura:  absent Prodrome:  absent Associated symptoms:  Nausea, photophobia, phonophobia, blurred vision.  She denies associated unilateral numbness or weakness. Duration:  off and on for week - since starting Nurtec, 2 days Frequency:  once a week - Started Nurtec and now gets about 2 a month Frequency of abortive medication: acetaminophen or ibuprofen several times a week due to generalized pain. Triggers:  stress, certain smells (dead leaves), jaw problems (wears dentures) Relieving factors:  rest in dark and quiet room Activity:  aggravates   She has had an eye exam this past year. CT head on 11/15/2015 was negative.      Past NSAIDS/analgesics:  ibuprofen 800mg , celecoxib, diclofenac, meloxicam, Fioricet with codeine Past abortive triptans:  none Past abortive ergotamine:  none Past muscle relaxants:  cyclobenzaprine, methocarbamol Past anti-emetic:  promethazine Past antihypertensive medications:  none Past antidepressant medications:  none Past anticonvulsant medications:  topiramate Past anti-CGRP:  Ubrelvy 100mg , Ajovy (injection site reaction), Zavzpret NS, Nurtec every other day.  Would not use Aimovig due to elevated blood pressure Past vitamins/Herbal/Supplements:  none Past antihistamines/decongestants:  Flonase Other past therapies:  [place] (sleepiness - cannot take at work)    Family history of headache:  mom (migraines) Works Editor, commissioning - cleaning and walking  PAST MEDICAL HISTORY: Past Medical History:  Diagnosis Date   Anxiety disorder 01/01/2021   Arthritis    Cardiac arrhythmia 01/01/2021   Chest pain    Chest pain of uncertain etiology 01/01/2021   Epigastric pain 01/01/2021   Essential hypertension 01/01/2021   Family history of coronary artery disease    Fever blister  Gastro-esophageal reflux disease without esophagitis 01/01/2021   GERD (gastroesophageal reflux disease)    Hepatic steatosis    High cholesterol     Hypertension    Migraines    Mixed hyperlipidemia 01/01/2021   Other long term (current) drug therapy 01/01/2021   Palpitations 01/01/2021   Primary osteoarthritis 01/01/2021   Refractory migraine with aura 01/01/2021   Vitamin D deficiency    Wrist pain 03/13/2013    MEDICATIONS: Current Outpatient Medications on File Prior to Visit  Medication Sig Dispense Refill   aspirin EC 81 MG tablet Take 1 tablet (81 mg total) by mouth daily. Swallow whole.     celecoxib (CELEBREX) 100 MG capsule Take 100 mg by mouth 2 (two) times daily as needed for mild pain or moderate pain.     celecoxib (CELEBREX) 50 MG capsule Take 50 mg by mouth 2 (two) times daily.     diclofenac Sodium (VOLTAREN) 1 % GEL Apply 4 g topically 3 (three) times daily as needed (joint pain).     gabapentin (NEURONTIN) 100 MG capsule Take 100 mg by mouth 2 (two) times daily.     hydrocortisone cream 1 % Apply 1 Application topically 2 (two) times daily as needed for itching.     metoprolol succinate (TOPROL-XL) 100 MG 24 hr tablet Take 100 mg by mouth daily.     metoprolol tartrate (LOPRESSOR) 50 MG tablet Take one tablet the evening before the CT scan and Take one tablet the morning of CT Scan 2 tablet 0   Misc Natural Products (BEET ROOT PO) Take 2 capsules by mouth daily. Total Beets     nitroGLYCERIN (NITROSTAT) 0.4 MG SL tablet Place 1 tablet (0.4 mg total) under the tongue every 5 (five) minutes as needed for chest pain. 25 tablet 11   ondansetron (ZOFRAN-ODT) 4 MG disintegrating tablet Take 4 mg by mouth every 8 (eight) hours as needed for nausea or vomiting.     pantoprazole (PROTONIX) 40 MG tablet Take 40 mg by mouth daily.     rosuvastatin (CRESTOR) 20 MG tablet Take 20 mg by mouth daily.     Semaglutide-Weight Management 0.5 MG/0.5ML SOAJ Inject 0.5 mg into the skin once a week.     senna-docusate (SENOKOT-S) 8.6-50 MG tablet Take 1 tablet by mouth daily.     verapamil (VERELAN) 240 MG 24 hr capsule Take 240 mg by  mouth daily.     No current facility-administered medications on file prior to visit.    ALLERGIES: Allergies  Allergen Reactions   Hydrocodone     Other reaction(s): sick on stomach   Latex Itching   Tramadol Hcl     Other reaction(s): hives    FAMILY HISTORY: Family History  Problem Relation Age of Onset   Heart attack Mother    Obesity Mother    Heart attack Father    Hypertension Father    Stroke Brother    Heart attack Brother    Heart disease Maternal Grandmother    Heart disease Maternal Grandfather    Hypertension Maternal Grandfather    Breast cancer Neg Hx       Objective:  *** General: No acute distress.  Patient appears well-groomed.   Head:  Normocephalic/atraumatic Neck:  Supple.  No paraspinal tenderness.  Full range of motion. Heart:  Regular rate and rhythm. Neuro:  ***   Shon Millet, DO   CC:  Deberah Castle, NP

## 2023-05-06 ENCOUNTER — Ambulatory Visit: Payer: BC Managed Care – PPO | Admitting: Neurology

## 2023-05-09 DIAGNOSIS — I2510 Atherosclerotic heart disease of native coronary artery without angina pectoris: Secondary | ICD-10-CM | POA: Insufficient documentation
